# Patient Record
Sex: Female | Born: 1990 | Race: White | Hispanic: No | Marital: Married | Smoking: Never smoker
Health system: Southern US, Community
[De-identification: ages and names within clinical notes are randomized; demographics above are authoritative.]

## PROBLEM LIST (undated history)

## (undated) DIAGNOSIS — F909 Attention-deficit hyperactivity disorder, unspecified type: Secondary | ICD-10-CM

## (undated) DIAGNOSIS — F419 Anxiety disorder, unspecified: Secondary | ICD-10-CM

## (undated) HISTORY — PX: WISDOM TOOTH EXTRACTION: SHX21

---

## 2016-02-22 ENCOUNTER — Encounter: Payer: Self-pay | Admitting: Emergency Medicine

## 2016-02-22 ENCOUNTER — Emergency Department: Payer: BLUE CROSS/BLUE SHIELD

## 2016-02-22 ENCOUNTER — Emergency Department
Admission: EM | Admit: 2016-02-22 | Discharge: 2016-02-22 | Disposition: A | Discharge status: Home / Self Care | Payer: BLUE CROSS/BLUE SHIELD | Attending: Emergency Medicine | Admitting: Emergency Medicine

## 2016-02-22 DIAGNOSIS — Z79899 Other long term (current) drug therapy: Secondary | ICD-10-CM | POA: Insufficient documentation

## 2016-02-22 DIAGNOSIS — D369 Benign neoplasm, unspecified site: Secondary | ICD-10-CM

## 2016-02-22 DIAGNOSIS — R1084 Generalized abdominal pain: Secondary | ICD-10-CM | POA: Diagnosis present

## 2016-02-22 DIAGNOSIS — R112 Nausea with vomiting, unspecified: Secondary | ICD-10-CM | POA: Diagnosis not present

## 2016-02-22 DIAGNOSIS — N83201 Unspecified ovarian cyst, right side: Secondary | ICD-10-CM | POA: Insufficient documentation

## 2016-02-22 DIAGNOSIS — R109 Unspecified abdominal pain: Secondary | ICD-10-CM

## 2016-02-22 DIAGNOSIS — N83202 Unspecified ovarian cyst, left side: Secondary | ICD-10-CM | POA: Diagnosis not present

## 2016-02-22 LAB — CBC
HCT: 41.5 % (ref 35.0–47.0)
Hemoglobin: 14.2 g/dL (ref 12.0–16.0)
MCH: 29.8 pg (ref 26.0–34.0)
MCHC: 34.1 g/dL (ref 32.0–36.0)
MCV: 87.4 fL (ref 80.0–100.0)
Platelets: 218 10*3/uL (ref 150–440)
RBC: 4.74 MIL/uL (ref 3.80–5.20)
RDW: 13.3 % (ref 11.5–14.5)
WBC: 14.8 10*3/uL — ABNORMAL HIGH (ref 3.6–11.0)

## 2016-02-22 LAB — COMPREHENSIVE METABOLIC PANEL
ALT: 16 U/L (ref 14–54)
AST: 24 U/L (ref 15–41)
Albumin: 4.4 g/dL (ref 3.5–5.0)
Alkaline Phosphatase: 72 U/L (ref 38–126)
Anion gap: 10 (ref 5–15)
BUN: 15 mg/dL (ref 6–20)
CO2: 22 mmol/L (ref 22–32)
Calcium: 9.2 mg/dL (ref 8.9–10.3)
Chloride: 104 mmol/L (ref 101–111)
Creatinine, Ser: 0.58 mg/dL (ref 0.44–1.00)
GFR calc Af Amer: >60 mL/min (ref >60)
GFR calc non Af Amer: >60 mL/min (ref >60)
Glucose, Bld: 106 mg/dL — ABNORMAL HIGH (ref 65–99)
Potassium: 3.5 mmol/L (ref 3.5–5.1)
Sodium: 136 mmol/L (ref 135–145)
Total Bilirubin: 1.2 mg/dL (ref 0.3–1.2)
Total Protein: 7.2 g/dL (ref 6.5–8.1)

## 2016-02-22 LAB — URINALYSIS COMPLETE WITH MICROSCOPIC (ARMC ONLY)
Bacteria, UA: NONE SEEN
Bilirubin Urine: NEGATIVE
Glucose, UA: NEGATIVE mg/dL
Hgb urine dipstick: NEGATIVE
Leukocytes, UA: NEGATIVE
Nitrite: NEGATIVE
Protein, ur: NEGATIVE mg/dL
Specific Gravity, Urine: 1.024 (ref 1.005–1.030)
pH: 6 (ref 5.0–8.0)

## 2016-02-22 LAB — LIPASE, BLOOD: Lipase: 19 U/L (ref 11–51)

## 2016-02-22 LAB — POCT PREGNANCY, URINE: Preg Test, Ur: NEGATIVE

## 2016-02-22 MED ORDER — ONDANSETRON HCL 4 MG/2ML IJ SOLN
4.0000 mg | Freq: Once | INTRAMUSCULAR | Status: AC
  Administered 2016-02-22: 4 mg via INTRAVENOUS
  Filled 2016-02-22: qty 2

## 2016-02-22 MED ORDER — MORPHINE SULFATE (PF) 4 MG/ML IV SOLN: 4.0000 mg | Freq: Once | INTRAVENOUS | Status: DC

## 2016-02-22 MED ORDER — OXYCODONE-ACETAMINOPHEN 5-325 MG PO TABS: 2.0000 | ORAL_TABLET | Freq: Four times a day (QID) | ORAL | 0 refills | Status: DC | PRN

## 2016-02-22 MED ORDER — IOPAMIDOL (ISOVUE-300) INJECTION 61%
75.0000 mL | Freq: Once | INTRAVENOUS | Status: AC | PRN
  Administered 2016-02-22: 75 mL via INTRAVENOUS

## 2016-02-22 MED ORDER — IBUPROFEN 800 MG PO TABS: 800.0000 mg | ORAL_TABLET | Freq: Three times a day (TID) | ORAL | 0 refills | Status: DC | PRN

## 2016-02-22 MED ORDER — MORPHINE SULFATE (PF) 4 MG/ML IV SOLN
4.0000 mg | Freq: Once | INTRAVENOUS | Status: AC
  Administered 2016-02-22: 4 mg via INTRAVENOUS
  Filled 2016-02-22: qty 1

## 2016-02-22 MED ORDER — DIATRIZOATE MEGLUMINE & SODIUM 66-10 % PO SOLN
15.0000 mL | Freq: Once | ORAL | Status: AC
  Administered 2016-02-22: 15 mL via ORAL

## 2016-02-22 NOTE — ED Triage Notes (Signed)
C/O vomiting started this morning at around 0630.  Shortly after, c/o mid - lower right abdominal pain.  Last vomited at around 0930.  Continues to c/o nausea.  Denies diarrhea.  Denies dysuria.

## 2016-02-22 NOTE — ED Notes (Signed)
Patient returned from Ultrasound. 

## 2016-02-22 NOTE — ED Provider Notes (Signed)
[facility] Emergency Department Provider Note  Time seen: 2:04 PM  I have reviewed the triage vital signs and the nursing notes.   HISTORY  Chief Complaint Abdominal Pain; Nausea; and Emesis    HPI Maria [name] is a 25 y.o. female with no past medical history presents the emergency department with nausea, vomiting and diffuse abdominal pain.Patient states she awoke this morning with nausea, vomiting and developed abdominal pain initially in the epigastrium which is spread to her entire abdomen. Describes the pain as moderate, dull aching pain throughout her entire abdomen. Denies diarrhea or dysuria. Denies hematuria. Denies vaginal bleeding or discharge. Patient is on Mirena birth control, and does not normally have. So she cannot tell me her last period. Describes the pain as moderate to severe.  History reviewed. No pertinent past medical history.  There are no active problems to display for this patient.   Past Surgical History:  Procedure Laterality Date  . WISDOM TOOTH EXTRACTION      Prior to Admission medications   Not on File    No Known Allergies  No family history on file.  Social History Social History  Substance Use Topics  . Smoking status: Never Smoker  . Smokeless tobacco: Never Used  . Alcohol use Yes     Comment: occasionally    Review of Systems Constitutional: Negative for fever Cardiovascular: Negative for chest pain. Respiratory: Negative for shortness of breath. Gastrointestinal: Diffuse abdominal pain. Positive for nausea or vomiting. Negative for diarrhea. Genitourinary: Negative for dysuria. Negative for vaginal bleeding or discharge. Musculoskeletal: Negative for back pain. Neurological: Negative for headaches, focal weakness or numbness. 10-point ROS otherwise negative.  ____________________________________________   PHYSICAL EXAM:  VITAL SIGNS: ED Triage Vitals  Enc Vitals Group     BP 02/22/16 1338 (!)  88/55     Pulse Rate 02/22/16 1338 (!) 108     Resp 02/22/16 1338 18     Temp 02/22/16 1338 98.3 F (36.8 C)     Temp Source 02/22/16 1338 Oral     SpO2 02/22/16 1338 98 %     Weight 02/22/16 1338 130 lb (59 kg)     Height 02/22/16 1338 5\' 4"  (1.626 m)     Head Circumference --      Peak Flow --      Pain Score 02/22/16 1339 10     Pain Loc --      Pain Edu? --      Excl. in [place]? --     Constitutional: Alert and oriented. Well appearing and in no distress. Eyes: Normal exam ENT   Head: Normocephalic and atraumatic.   Mouth/Throat: Mucous membranes are moist. Cardiovascular: Normal rate, regular rhythm. No murmur Respiratory: Normal respiratory effort without tachypnea nor retractions. Breath sounds are clear Gastrointestinal: Soft, moderate diffuse abdominal tenderness to palpation. Not able to localize a more tender area than any other spot. Musculoskeletal: Nontender with normal range of motion in all extremities. Neurologic:  Normal speech and language. No gross focal neurologic deficits Psychiatric: Mood and affect are normal. Speech and behavior are normal.   ____________________________________________   RADIOLOGY  CT pending  ____________________________________________   INITIAL IMPRESSION / ASSESSMENT AND PLAN / ED COURSE  Pertinent labs & imaging results that were available during my care of the patient were reviewed by me and considered in my medical decision making (see chart for details).  The patient presents to the emergency department with diffuse abdominal pain, nausea and vomiting all  beginning this morning. Initially patient with a low blood pressure 88/55 and very pale appearance. I performed a bedside fast ultrasound which was negative for fluid. Patient's blood pressure came up to 106 without intervention. At this time it is unclear what is causing the patient's discomfort. She has diffuse abdominal tenderness to palpation, I am not able to  localize a more tender area. We will check labs, IV hydrate, treat pain and nausea, and proceed with a CT of the abdomen/pelvis. Patient's POC pregnancy test is negative.   Patient's labs show a leukocytosis, largely otherwise within normal limits. Patient is feeling better after medications and fluids. We'll proceed to CT abdomen/pelvis to further evaluate.  Patient care signed out to Dr. Jimmye Norman, CT pending.  ____________________________________________   FINAL CLINICAL IMPRESSION(S) / ED DIAGNOSES  Abdominal pain Nausea and vomiting    Harvest Dark, MD 02/22/16 1443

## 2016-02-22 NOTE — ED Notes (Signed)
Patient transported to Ultrasound 

## 2016-02-22 NOTE — ED Provider Notes (Signed)
IMPRESSION: 1. Left adnexal dermoid measuring up to 3.7 cm. 2. Dominant cyst in the right ovary measures 4.2 cm. This is almost certainly benign, and no specific imaging follow up is recommended according to the Society of Radiologists in [place] Statement (D Clovis Riley et al. Management of Asymptomatic Ovarian and Other Adnexal Cysts Imaged at [place]: Society of Radiologists in [place] Statement 2010. Radiology 256 (Sept 2010): 943-954.). 3. IUD in place. 4. Normal arterial and venous blood flow to both ovaries without evidence for torsion.  Patient is in no acute distress, she'll be referred to GYN for outpatient follow-up. Currently she is pain-free.   Earleen Newport, MD 02/22/16 [phone]

## 2016-02-22 NOTE — ED Notes (Signed)
Pt presents with nausea vomiting since about 6 am today.  Approximately 4 episodes of vomiting.  Diffuse abdominal pain 10/10 at first then localizing more to the right abdomen and around to back.

## 2016-02-26 ENCOUNTER — Encounter
Admission: RE | Admit: 2016-02-26 | Discharge: 2016-02-26 | Disposition: A | Discharge status: Home / Self Care | Payer: BLUE CROSS/BLUE SHIELD | Source: Ambulatory Visit | Attending: Obstetrics and Gynecology | Admitting: Obstetrics and Gynecology

## 2016-02-26 DIAGNOSIS — Z01812 Encounter for preprocedural laboratory examination: Secondary | ICD-10-CM | POA: Diagnosis present

## 2016-02-26 HISTORY — DX: Attention-deficit hyperactivity disorder, unspecified type: F90.9

## 2016-02-26 LAB — CBC
HCT: 37.4 % (ref 35.0–47.0)
Hemoglobin: 13.4 g/dL (ref 12.0–16.0)
MCH: 31 pg (ref 26.0–34.0)
MCHC: 36 g/dL (ref 32.0–36.0)
MCV: 86.2 fL (ref 80.0–100.0)
Platelets: 187 10*3/uL (ref 150–440)
RBC: 4.33 MIL/uL (ref 3.80–5.20)
RDW: 12.9 % (ref 11.5–14.5)
WBC: 4.4 10*3/uL (ref 3.6–11.0)

## 2016-02-26 LAB — BASIC METABOLIC PANEL
Anion gap: 6 (ref 5–15)
BUN: 8 mg/dL (ref 6–20)
CO2: 28 mmol/L (ref 22–32)
Calcium: 9.2 mg/dL (ref 8.9–10.3)
Chloride: 106 mmol/L (ref 101–111)
Creatinine, Ser: 0.59 mg/dL (ref 0.44–1.00)
GFR calc Af Amer: >60 mL/min (ref >60)
GFR calc non Af Amer: >60 mL/min (ref >60)
Glucose, Bld: 85 mg/dL (ref 65–99)
Potassium: 4.2 mmol/L (ref 3.5–5.1)
Sodium: 140 mmol/L (ref 135–145)

## 2016-02-26 LAB — TYPE AND SCREEN
ABO/RH(D): O POS
Antibody Screen: NEGATIVE

## 2016-02-26 NOTE — H&P (Signed)
[name] is a 25 y.o. female here for Ovarian Cyst .  24yo G0 presented to the ER on 7/29 with acute onset abdominal/pelvic [name] with nausea/vomiting. Preg test neg. No vaginal bleeding.  CT scan found LEFT ovarian dermoid cyst [id number] and right follicular cyst, confirmed by TVUS. No evidence of torsion. [name] with po meds, pt discharged.  [name]. She states it comes and goes but is mild. No nausea/ vomiting  Mirena in place. No menses. Unknown LMP.  Prior hx of WTE without complications.  Maternal grandmother with hx of ovarian cysts, but no surgeries or cancer that she is aware of.  Past Medical History:  has no past medical history on file.  Past Surgical History:  has a past surgical history that includes wisdom teeth extraction (Bilateral). Family History: Family history is unknown by patient. Social History:  reports that she has never smoked. She has never used smokeless tobacco. She reports that she drinks alcohol. She reports that she does not use illicit drugs. OB/GYN History:  OB History    Gravida Para Term Preterm AB Living   0 0 0 0 0 0   SAB TAB Ectopic Multiple Live Births   0 0 0 0 0      Allergies: has no allergies on file. Medications:  Current Outpatient Prescriptions:  .  dextroamphetamine-amphetamine (ADDERALL) 10 mg tablet, Take by mouth., Disp: , Rfl:  .  ibuprofen (ADVIL,MOTRIN) 800 MG tablet, Take by mouth., Disp: , Rfl:  .  levonorgestrel (MIRENA) 20 mcg/24 hr (5 years) IUD, Insert into the uterus., Disp: , Rfl:  .  oxyCODONE-acetaminophen (PERCOCET) 5-325 mg tablet, Take by mouth., Disp: , Rfl:   Review of Systems: No SOB, no palpitations or chest [name], no new lower extremity edema, no nausea or vomiting or bowel or bladder complaints. See HPI for gyn specific ROS.    Exam:      Vitals:   02/23/16 1444  BP: (P) 106/74  Pulse: (P) 105   Body mass index is 22.59 kg/(m^2) (pended).  General:  Well-developed, well-nourished white female in no acute distress Body mass index is 22.59 kg/(m^2) (pended). Skin: No rashes, ulcers or skin lesions noted. No excessive hirsutism or acne noted.  Neurological: Appears alert and oriented and is a good historian. No gross abnormalities are noted. Psychological: Normal affect and mood. No signs of anxiety or depression noted. Lungs: CTA  CV : RRR without murmur   Breast: deferred Neck:  no thyromegaly Abdomen: Soft, nontender, without hepatosplenomegaly or masses. No hernias. Pelvic exam: Deferred    Impression:   The primary encounter diagnosis was Dermoid cyst of ovary, left. A diagnosis of [name] was also pertinent to this visit.    Plan:   - Patient returns for a preoperative discussion regarding her plans to proceed with surgical treatment of her 4cm LEFT dermoid by dx lap, left ovarian cystectomy, possible oopherectomy procedure. There is no evidence of torsion or acute abdomen. She is aware that we may have to remove an ovary, but that this is not optimal.  The patient and I discussed the technical aspects of the procedure including the potential for risks and complications. These include but are not limited to the risk of infection requiring post-operative antibiotics or further procedures. We talked about the risk of injury to adjacent organs including bladder, bowel, ureter, blood vessels or nerves. We talked about the need to convert to an open incision. We talked  about the possible need for blood transfusion. We talked aboutpostop complications such asthromboembolic or cardiopulmonary complications. All of her questions were answered.  Her preoperative exam was completed and the appropriate consents were signed. She is scheduled to undergo this procedure in the near future, this week if possible.  Specific Peri-operative Considerations:  - Consent: obtained today - Labs: CBC, CMP preoperatively -  Studies: EKG, CXR preoperatively - Bowel Preparation: None required - Abx:  Ancef2g - VTE ppx: SCDs perioperatively - Glucose Protocol: n/a - Beta-blockade: n/a  Return in about 2 weeks (around 03/08/2016) for Postop check.

## 2016-02-26 NOTE — Patient Instructions (Signed)
  Your procedure is scheduled on: 03/01/16 Report to Day Surgery. [facility]OND [facility]LOOR To find out your arrival time please call [phone] between 1PM - 3PM on 02/27/16  Remember: Instructions that are not followed completely may result in serious medical risk, up to and including death, or upon the discretion of your surgeon and anesthesiologist your surgery may need to be rescheduled.    _X___ 1. Do not eat food or drink liquids after midnight. No gum chewing or hard candies.     _X___ 2. No Alcohol for 24 hours before or after surgery.   _X___ 3. Do Not Smoke For 24 Hours Prior to Your Surgery.   ____ 4. Bring all medications with you on the day of surgery if instructed.    X____ 5. Notify your doctor if there is any change in your medical condition     (cold, fever, infections).       Do not wear jewelry, make-up, hairpins, clips or nail polish.  Do not wear lotions, powders, or perfumes. You may wear deodorant.  Do not shave 48 hours prior to surgery. Men may shave face and neck.  Do not bring valuables to the hospital.    [facility] is not responsible for any belongings or valuables.               Contacts, dentures or bridgework may not be worn into surgery.  Leave your suitcase in the car. After surgery it may be brought to your room.  For patients admitted to the hospital, discharge time is determined by your                treatment team.   Patients discharged the day of surgery will not be allowed to drive home.   Please read over the following fact sheets that you were given:   Surgical Site Infection Prevention   ____ Take these medicines the morning of surgery with A SIP OF WATER:    1. NONE  2.   3.   4.  5.  6.  ____ Fleet Enema (as directed)   _C___ Use CHG Soap as directed  ____ Use inhalers on the day of surgery  ____ Stop metformin 2 days prior to surgery    ____ Take 1/2 of usual insulin dose the night before surgery and none on the  morning of surgery.   ____ Stop Coumadin/Plavix/aspirin on   _X___ Stop Anti-inflammatories on     NO ADVIL UNTIL AFTER SURGERY       OK TO TAKE TYLENOL   ____ Stop supplements until after surgery.    ____ Bring C-Pap to the hospital.

## 2016-03-01 ENCOUNTER — Ambulatory Visit
Admission: RE | Admit: 2016-03-01 | Discharge: 2016-03-01 | Disposition: A | Discharge status: Home / Self Care | Payer: BLUE CROSS/BLUE SHIELD | Source: Ambulatory Visit | Attending: Obstetrics and Gynecology | Admitting: Obstetrics and Gynecology

## 2016-03-01 ENCOUNTER — Ambulatory Visit: Payer: BLUE CROSS/BLUE SHIELD | Admitting: Anesthesiology

## 2016-03-01 ENCOUNTER — Encounter: Payer: Self-pay | Admitting: *Deleted

## 2016-03-01 ENCOUNTER — Encounter
Admission: RE | Disposition: A | Discharge status: Home / Self Care | Payer: Self-pay | Source: Ambulatory Visit | Attending: Obstetrics and Gynecology

## 2016-03-01 DIAGNOSIS — D271 Benign neoplasm of left ovary: Secondary | ICD-10-CM | POA: Diagnosis not present

## 2016-03-01 DIAGNOSIS — N8301 Follicular cyst of right ovary: Secondary | ICD-10-CM | POA: Diagnosis not present

## 2016-03-01 DIAGNOSIS — Z842 Family history of other diseases of the genitourinary system: Secondary | ICD-10-CM | POA: Insufficient documentation

## 2016-03-01 DIAGNOSIS — Z79899 Other long term (current) drug therapy: Secondary | ICD-10-CM | POA: Diagnosis not present

## 2016-03-01 DIAGNOSIS — N83202 Unspecified ovarian cyst, left side: Secondary | ICD-10-CM | POA: Diagnosis present

## 2016-03-01 DIAGNOSIS — F909 Attention-deficit hyperactivity disorder, unspecified type: Secondary | ICD-10-CM | POA: Insufficient documentation

## 2016-03-01 HISTORY — PX: LAPAROSCOPIC OVARIAN CYSTECTOMY: SHX6248

## 2016-03-01 HISTORY — PX: LAPAROSCOPY: SHX197

## 2016-03-01 LAB — POCT URINE PREGNANCY: Preg Test, Ur: NEGATIVE

## 2016-03-01 SURGERY — LAPAROSCOPY, DIAGNOSTIC
Anesthesia: General

## 2016-03-01 MED ORDER — FAMOTIDINE 20 MG PO TABS
20.0000 mg | ORAL_TABLET | Freq: Once | ORAL | Status: AC
  Administered 2016-03-01: 20 mg via ORAL

## 2016-03-01 MED ORDER — NEOSTIGMINE METHYLSULFATE 10 MG/10ML IV SOLN
INTRAVENOUS | Status: DC | PRN
  Administered 2016-03-01: 4 mg via INTRAVENOUS

## 2016-03-01 MED ORDER — OXYCODONE HCL 5 MG/5ML PO SOLN: 5.0000 mg | Freq: Once | ORAL | Status: AC | PRN

## 2016-03-01 MED ORDER — FAMOTIDINE 20 MG PO TABS
ORAL_TABLET | ORAL | Status: AC
  Administered 2016-03-01: 20 mg via ORAL
  Filled 2016-03-01: qty 1

## 2016-03-01 MED ORDER — ESMOLOL HCL 100 MG/10ML IV SOLN
INTRAVENOUS | Status: DC | PRN
  Administered 2016-03-01 (×2): 20 mg via INTRAVENOUS

## 2016-03-01 MED ORDER — LIDOCAINE HCL (CARDIAC) 20 MG/ML IV SOLN
INTRAVENOUS | Status: DC | PRN
  Administered 2016-03-01: 60 mg via INTRAVENOUS

## 2016-03-01 MED ORDER — OXYCODONE HCL 5 MG PO TABS
5.0000 mg | ORAL_TABLET | Freq: Once | ORAL | Status: AC | PRN
  Administered 2016-03-01: 5 mg via ORAL

## 2016-03-01 MED ORDER — DOCUSATE SODIUM 100 MG PO CAPS: 100.0000 mg | ORAL_CAPSULE | Freq: Every day | ORAL | 1 refills | Status: DC | PRN

## 2016-03-01 MED ORDER — DEXAMETHASONE SODIUM PHOSPHATE 10 MG/ML IJ SOLN
INTRAMUSCULAR | Status: DC | PRN
  Administered 2016-03-01: 10 mg via INTRAVENOUS

## 2016-03-01 MED ORDER — SODIUM CHLORIDE 0.9 % IR SOLN
Status: DC | PRN
  Administered 2016-03-01: 1300 mL

## 2016-03-01 MED ORDER — ROCURONIUM BROMIDE 100 MG/10ML IV SOLN
INTRAVENOUS | Status: DC | PRN
  Administered 2016-03-01: 10 mg via INTRAVENOUS
  Administered 2016-03-01: 30 mg via INTRAVENOUS
  Administered 2016-03-01 (×3): 10 mg via INTRAVENOUS

## 2016-03-01 MED ORDER — OXYCODONE-ACETAMINOPHEN 5-325 MG PO TABS: 1.0000 | ORAL_TABLET | Freq: Four times a day (QID) | ORAL | 0 refills | Status: DC | PRN

## 2016-03-01 MED ORDER — KETOROLAC TROMETHAMINE 30 MG/ML IJ SOLN
30.0000 mg | Freq: Once | INTRAMUSCULAR | Status: AC
  Administered 2016-03-01: 30 mg via INTRAVENOUS

## 2016-03-01 MED ORDER — MIDAZOLAM HCL 2 MG/2ML IJ SOLN
INTRAMUSCULAR | Status: DC | PRN
Start: 2016-03-01 — End: 2016-03-01
  Administered 2016-03-01: 2 mg via INTRAVENOUS

## 2016-03-01 MED ORDER — ONDANSETRON HCL 4 MG/2ML IJ SOLN
INTRAMUSCULAR | Status: DC | PRN
  Administered 2016-03-01: 4 mg via INTRAVENOUS

## 2016-03-01 MED ORDER — KETOROLAC TROMETHAMINE 30 MG/ML IJ SOLN
INTRAMUSCULAR | Status: AC
  Filled 2016-03-01: qty 1

## 2016-03-01 MED ORDER — FENTANYL CITRATE (PF) 100 MCG/2ML IJ SOLN
INTRAMUSCULAR | Status: DC | PRN
  Administered 2016-03-01: 100 ug via INTRAVENOUS
  Administered 2016-03-01 (×3): 50 ug via INTRAVENOUS

## 2016-03-01 MED ORDER — FENTANYL CITRATE (PF) 100 MCG/2ML IJ SOLN: 25.0000 ug | INTRAMUSCULAR | Status: DC | PRN

## 2016-03-01 MED ORDER — IBUPROFEN 800 MG PO TABS: 800.0000 mg | ORAL_TABLET | Freq: Three times a day (TID) | ORAL | 1 refills | Status: DC | PRN

## 2016-03-01 MED ORDER — LACTATED RINGERS IV SOLN: INTRAVENOUS | Status: DC

## 2016-03-01 MED ORDER — BUPIVACAINE HCL 0.5 % IJ SOLN
INTRAMUSCULAR | Status: DC | PRN
  Administered 2016-03-01 (×2): 15 mL

## 2016-03-01 MED ORDER — BUPIVACAINE HCL (PF) 0.5 % IJ SOLN
INTRAMUSCULAR | Status: AC
  Filled 2016-03-01: qty 30

## 2016-03-01 MED ORDER — OXYCODONE HCL 5 MG PO TABS
ORAL_TABLET | ORAL | Status: AC
  Administered 2016-03-01: 5 mg via ORAL
  Filled 2016-03-01: qty 1

## 2016-03-01 MED ORDER — METHYLENE BLUE 0.5 % INJ SOLN
INTRAVENOUS | Status: AC
  Filled 2016-03-01: qty 10

## 2016-03-01 MED ORDER — LACTATED RINGERS IV SOLN
INTRAVENOUS | Status: DC
  Administered 2016-03-01 (×2): via INTRAVENOUS

## 2016-03-01 MED ORDER — PROPOFOL 10 MG/ML IV BOLUS
INTRAVENOUS | Status: DC | PRN
  Administered 2016-03-01: 150 mg via INTRAVENOUS

## 2016-03-01 MED ORDER — GLYCOPYRROLATE 0.2 MG/ML IJ SOLN
INTRAMUSCULAR | Status: DC | PRN
  Administered 2016-03-01: 0.6 mg via INTRAVENOUS

## 2016-03-01 SURGICAL SUPPLY — 45 items
APPLICATOR ARISTA FLEXITIP XL (MISCELLANEOUS) ×3 IMPLANT
BAG URO DRAIN 2000ML W/SPOUT (MISCELLANEOUS) ×3 IMPLANT
BLADE SURG SZ11 CARB STEEL (BLADE) ×3 IMPLANT
CATH FOLEY 2WAY  5CC 16FR (CATHETERS)
CATH ROBINSON RED A/P 16FR (CATHETERS) ×3 IMPLANT
CATH URTH 16FR FL 2W BLN LF (CATHETERS) IMPLANT
CHLORAPREP W/TINT 26ML (MISCELLANEOUS) ×3 IMPLANT
DRSG TEGADERM 2-3/8X2-3/4 SM (GAUZE/BANDAGES/DRESSINGS) ×12 IMPLANT
ENDOPOUCH RETRIEVER 10 (MISCELLANEOUS) ×3 IMPLANT
GAUZE SPONGE NON-WVN 2X2 STRL (MISCELLANEOUS) ×4 IMPLANT
GLOVE BIO SURGEON STRL SZ 6.5 (GLOVE) ×12 IMPLANT
GLOVE INDICATOR 7.0 STRL GRN (GLOVE) ×12 IMPLANT
GLOVE SURG LX 6.5 MICRO (GLOVE) ×1
GLOVE SURG LX STRL 6.5 MICRO (GLOVE) ×2 IMPLANT
GOWN STRL REUS W/ TWL LRG LVL3 (GOWN DISPOSABLE) ×4 IMPLANT
GOWN STRL REUS W/TWL LRG LVL3 (GOWN DISPOSABLE) ×2
GRASPER SUT TROCAR 14GX15 (MISCELLANEOUS) ×3 IMPLANT
HEMOSTAT ARISTA ABSORB 1G (MISCELLANEOUS) ×3 IMPLANT
HEMOSTAT SURGICEL 2X3 (HEMOSTASIS) ×3 IMPLANT
IRRIGATION STRYKERFLOW (MISCELLANEOUS) ×2 IMPLANT
IRRIGATOR STRYKERFLOW (MISCELLANEOUS) ×3
IV SOD CHL 0.9% 1000ML (IV SOLUTION) ×3 IMPLANT
KIT RM TURNOVER CYSTO AR (KITS) ×3 IMPLANT
LABEL OR SOLS (LABEL) ×3 IMPLANT
LIGASURE 5MM LAPAROSCOPIC (INSTRUMENTS) IMPLANT
LIQUID BAND (GAUZE/BANDAGES/DRESSINGS) ×3 IMPLANT
NS IRRIG 500ML POUR BTL (IV SOLUTION) ×3 IMPLANT
PACK GYN LAPAROSCOPIC (MISCELLANEOUS) ×3 IMPLANT
PAD OB MATERNITY 4.3X12.25 (PERSONAL CARE ITEMS) ×3 IMPLANT
PAD PREP 24X41 OB/GYN DISP (PERSONAL CARE ITEMS) ×3 IMPLANT
SCISSORS METZENBAUM CVD 33 (INSTRUMENTS) ×3 IMPLANT
SLEEVE ENDOPATH XCEL 5M (ENDOMECHANICALS) ×6 IMPLANT
SPONGE VERSALON 2X2 STRL (MISCELLANEOUS) ×2
SPONGE XRAY 4X4 16PLY STRL (MISCELLANEOUS) ×3 IMPLANT
STRIP CLOSURE SKIN 1/4X4 (GAUZE/BANDAGES/DRESSINGS) ×3 IMPLANT
SUT MNCRL AB 4-0 PS2 18 (SUTURE) ×3 IMPLANT
SUT VIC AB 0 CT1 36 (SUTURE) ×3 IMPLANT
SUT VIC AB 2-0 UR6 27 (SUTURE) ×3 IMPLANT
SUT VIC AB 4-0 SH 27 (SUTURE) ×1
SUT VIC AB 4-0 SH 27XANBCTRL (SUTURE) ×2 IMPLANT
SWABSTK COMLB BENZOIN TINCTURE (MISCELLANEOUS) IMPLANT
TROCAR ENDO BLADELESS 11MM (ENDOMECHANICALS) ×3 IMPLANT
TROCAR XCEL NON-BLD 5MMX100MML (ENDOMECHANICALS) ×3 IMPLANT
TROCAR XCEL UNIV SLVE 11M 100M (ENDOMECHANICALS) ×3 IMPLANT
TUBING INSUFFLATOR HI FLOW (MISCELLANEOUS) ×3 IMPLANT

## 2016-03-01 NOTE — Op Note (Addendum)
Maria [name] PROCEDURE DATE: 03/01/2016  PREOPERATIVE DIAGNOSIS: Left ovarian cyst POSTOPERATIVE DIAGNOSIS: Same PROCEDURE: Diagnostic laparoscopy, left ovarian cystectomy SURGEON:  Dr. Benjaman Kindler ASSISTANT: RN-FA ANESTHESIOLOGIST: Andria Frames, MD Anesthesiologist: Andria Frames, MD CRNA: Lance Muss, CRNA; Leander Rams, CRNA  INDICATIONS: 25 y.o. G0 with left dermoid cyst measuring 4cm by ultrasound and CT.  Please see preoperative notes for further details.  Risks of surgery were discussed with the patient including but not limited to: bleeding which may require transfusion or reoperation; infection which may require antibiotics; injury to bowel, bladder, ureters or other surrounding organs; need for additional procedures including laparotomy; thromboembolic phenomenon, incisional problems and other postoperative/anesthesia complications. Written informed consent was obtained.    FINDINGS:  Small uterus, normal right ovary and fallopian tubes bilaterally.  Left ovary with large ovarian cyst. Normal upper abdomen. No adhesions.  ANESTHESIA:    General INTRAVENOUS FLUIDS: 1400 ml ESTIMATED BLOOD LOSS: 25 ml URINE OUTPUT: 75 ml SPECIMENS:left ovarian cyst, pelvic washings COMPLICATIONS: None immediate  PROCEDURE IN DETAIL:  The patient had sequential compression devices applied to her lower extremities while in the preoperative area.  She was then taken to the operating room where general anesthesia was administered and was found to be adequate.  She was placed in the dorsal lithotomy position, and was prepped and draped in a sterile manner.  A Foley catheter was inserted into her bladder and attached to constant drainage and a sponge stick was placed into the vagina. No uterine manipulator was used. IUD strings were noted at the cervical os.   After an adequate timeout was performed, attention was turned to the abdomen where an umbilical incision was made with the scalpel.   The Optiview 11-mm trocar and sleeve were then advanced without difficulty with the laparoscope under direct visualization into the abdomen.  The abdomen was then insufflated with carbon dioxide gas and adequate pneumoperitoneum was obtained.   A detailed survey of the patient's pelvis and abdomen revealed the findings as mentioned above.   Pelvic washings were obtained.  After infusion of 1% lidocaine at the proposed trocar sites, a 10 mm trocar was placed in the left lower quadrant and a 5 mm trocar was placed in the right lower quadrant. A 10 mm Endo Catch bag was placed under the left adnexa. The left fallopian tube was gently displaced from the bag. Another 5 mm trocar was placed in the right lower quadrant to allow for appropriate manipulation. Using monopolar scissors, a small incision was made in the ovarian cortex above the ovarian cyst. The cyst wall was gently dissected away from the ovarian tissue, being careful not to rupture the cyst. Once completed, the cyst was dropped into the Endo Catch bag with no cyst content spillage into the peritoneal cavity. Yellow fluid did extrude from the cyst wall in the bag, mixed with hair, confirming this as a dermoid cyst.  The cyst was removed through the left lower port site without spillage into the subcutaneous tissue. The trocar was replaced, and 3 L of normal saline was used to irrigate the pelvis. The bipolar Kleppinger was used to assure hemostasis in the ovarian cyst bed, which was bleeding, and Surgicel was placed in the site and held firmly. The pressure in the abdomen was dropped to 6 mmHg, and the operative site was assured to be hemostatic.  No intraoperative injury to surrounding organs was noted. The fascia of the two 10 mm sites were closed with 0 Vicryl laparoscopically. Pictures  were taken of the quadrants and pelvis. The abdomen was desufflated and all instruments were then removed from the patient's abdomen. The vaginal manipulator was  removed without complications.  All incisions were closed with 4-0 Monocryl and Dermabond.   The patient tolerated the procedures well.  All instruments, needles, and sponge counts were correct x 2. The patient was taken to the recovery room in stable condition.

## 2016-03-01 NOTE — Anesthesia Preprocedure Evaluation (Signed)
Anesthesia Evaluation  Patient identified by MRN, date of birth, ID band Patient awake    Reviewed: Allergy & Precautions, H&P , NPO status , Patient's Chart, lab work & pertinent test results  Airway Mallampati: II  TM Distance: >3 FB Neck ROM: full    Dental  (+) Poor Dentition   Pulmonary neg pulmonary ROS, neg shortness of breath,    Pulmonary exam normal breath sounds clear to auscultation       Cardiovascular Exercise Tolerance: Good (-) angina(-) Past MI and (-) DOE negative cardio ROS Normal cardiovascular exam Rhythm:regular Rate:Normal     Neuro/Psych PSYCHIATRIC DISORDERS negative neurological ROS     GI/Hepatic negative GI ROS, Neg liver ROS,   Endo/Other  negative endocrine ROS  Renal/GU negative Renal ROS  negative genitourinary   Musculoskeletal   Abdominal   Peds  Hematology negative hematology ROS (+)   Anesthesia Other Findings Past Medical History: No date: ADHD (attention deficit hyperactivity disorder)  Past Surgical History: No date: WISDOM TOOTH EXTRACTION No date: WISDOM TOOTH EXTRACTION  BMI    Body Mass Index:  22.31 kg/m      Reproductive/Obstetrics negative OB ROS                             Anesthesia Physical Anesthesia Plan  ASA: II  Anesthesia Plan: General ETT   Post-op Pain Management:    Induction:   Airway Management Planned:   Additional Equipment:   Intra-op Plan:   Post-operative Plan:   Informed Consent: I have reviewed the patients History and Physical, chart, labs and discussed the procedure including the risks, benefits and alternatives for the proposed anesthesia with the patient or authorized representative who has indicated his/her understanding and acceptance.   Dental Advisory Given  Plan Discussed with: Anesthesiologist, CRNA and Surgeon  Anesthesia Plan Comments:         Anesthesia Quick Evaluation

## 2016-03-01 NOTE — Transfer of Care (Signed)
Immediate Anesthesia Transfer of Care Note  Patient: Maria [name]  Procedure(s) Performed: Procedure(s): LAPAROSCOPY DIAGNOSTIC (N/A) LAPAROSCOPIC OVARIAN CYSTECTOMY (Left)  Patient Location: PACU  Anesthesia Type:General  Level of Consciousness: awake  Airway & Oxygen Therapy: Patient Spontanous Breathing  Post-op Assessment: Report given to RN  Post vital signs: stable  Last Vitals:  Vitals:   03/01/16 0616 03/01/16 1028  BP: 108/65 116/71  Pulse: 99 80  Resp: 16 14  Temp: 36.9 C 36.2 C    Last Pain:  Vitals:   03/01/16 1028  TempSrc:   PainSc: Asleep         Complications: No apparent anesthesia complications

## 2016-03-01 NOTE — Anesthesia Postprocedure Evaluation (Signed)
Anesthesia Post Note  Patient: Maria [name]  Procedure(s) Performed: Procedure(s) (LRB): LAPAROSCOPY DIAGNOSTIC (N/A) LAPAROSCOPIC OVARIAN CYSTECTOMY (Left)  Patient location during evaluation: PACU Anesthesia Type: General Level of consciousness: awake and alert Pain management: pain level controlled Vital Signs Assessment: post-procedure vital signs reviewed and stable Respiratory status: spontaneous breathing, nonlabored ventilation, respiratory function stable and patient connected to nasal cannula oxygen Cardiovascular status: blood pressure returned to baseline and stable Postop Assessment: no signs of nausea or vomiting Anesthetic complications: no    Last Vitals:  Vitals:   03/01/16 1115 03/01/16 1129  BP: 107/67 106/71  Pulse: (!) 101 89  Resp: 19 18  Temp: 36.3 C 36.6 C    Last Pain:  Vitals:   03/01/16 1129  TempSrc: Oral  PainSc: 6                  Maria [name]

## 2016-03-01 NOTE — Anesthesia Procedure Notes (Signed)
Procedure Name: Intubation Date/Time: 03/01/2016 7:45 AM Performed by: Leander Rams Pre-anesthesia Checklist: Patient identified, Emergency Drugs available, Suction available, Patient being monitored and Timeout performed Patient Re-evaluated:Patient Re-evaluated prior to inductionOxygen Delivery Method: Circle system utilized Intubation Type: IV induction Ventilation: Mask ventilation without difficulty Laryngoscope Size: Mac and 3 Grade View: Grade I Tube type: Oral Tube size: 6.5 mm Number of attempts: 2 Airway Equipment and Method: Stylet Secured at: 23 cm Tube secured with: Tape Dental Injury: Teeth and Oropharynx as per pre-operative assessment

## 2016-03-01 NOTE — Discharge Instructions (Signed)
AMBULATORY SURGERY  DISCHARGE INSTRUCTIONS   1) The drugs that you were given will stay in your system until tomorrow so for the next 24 hours you should not:  A) Drive an automobile B) Make any legal decisions C) Drink any alcoholic beverage   2) You may resume regular meals tomorrow.  Today it is better to start with liquids and gradually work up to solid foods.  You may eat anything you prefer, but it is better to start with liquids, then soup and crackers, and gradually work up to solid foods.   3) Please notify your doctor immediately if you have any unusual bleeding, trouble breathing, redness and pain at the surgery site, drainage, fever, or pain not relieved by medication.    4) Additional Instructions:        Please contact your physician with any problems or Same Day Surgery at [phone], Monday through Friday 6 am to 4 pm, or [place] at [facility] number at [phone].Laparoscopic Ovarian Surgery Discharge Instructions  RISKS AND COMPLICATIONS   Infection.  Bleeding.  Injury to surrounding organs.  Anesthetic side effects.   PROCEDURE   You may be given a medicine to help you relax (sedative) before the procedure. You will be given a medicine to make you sleep (general anesthetic) during the procedure.  A tube will be put down your throat to help your breath while under general anesthesia.  Several small cuts (incisions) are made in the lower abdominal area and one incision is made near the belly button.  Your abdominal area will be inflated with a safe gas (carbon dioxide). This helps give the surgeon room to operate, visualize, and helps the surgeon avoid other organs.  A thin, lighted tube (laparoscope) with a camera attached is inserted into your abdomen through the incision near the belly button. Other small instruments may also be inserted through other abdominal incisions.  The ovary is located and are removed.  After the ovary is  removed, the gas is released from the abdomen.  The incisions will be closed with stitches (sutures), and Dermabond. A bandage may be placed over the incisions.  AFTER THE PROCEDURE   You will also have some mild abdominal discomfort for 3-7 days. You will be given pain medicine to ease any discomfort.  As long as there are no problems, you may be allowed to go home. Someone will need to drive you home and be with you for at least 24 hours once home.  You may have some mild discomfort in the throat. This is from the tube placed in your throat while you were sleeping.  You may experience discomfort in the shoulder area from some trapped air between the liver and diaphragm. This sensation is normal and will slowly go away on its own.  HOME CARE INSTRUCTIONS   Take all medicines as directed.  Only take over-the-counter or prescription medicines for pain, discomfort, or fever as directed by your caregiver.  Resume daily activities as directed.  Showers are preferred over baths for 2 weeks.  You may resume sexual activities in 1 week or as you feel you would like to.  Do not drive while taking narcotics.  SEEK MEDICAL CARE IF: .  There is increasing abdominal pain.  You feel lightheaded or faint.  You have the chills.  You have an oral temperature above 102 F (38.9 C).  There is pus-like (purulent) drainage from any of the wounds.  You are unable to pass gas or  have a bowel movement.  You feel sick to your stomach (nauseous) or throw up (vomit) and can't control it with your medicines.  MAKE SURE YOU:   Understand these instructions.  Will watch your condition.  Will get help right away if you are not doing well or get worse.  ExitCare Patient Information 2013 [place].   AMBULATORY SURGERY  DISCHARGE INSTRUCTIONS   5) The drugs that you were given will stay in your system until tomorrow so for the next 24 hours you should not:  D) Drive an  automobile E) Make any legal decisions F) Drink any alcoholic beverage   6) You may resume regular meals tomorrow.  Today it is better to start with liquids and gradually work up to solid foods.  You may eat anything you prefer, but it is better to start with liquids, then soup and crackers, and gradually work up to solid foods.   7) Please notify your doctor immediately if you have any unusual bleeding, trouble breathing, redness and pain at the surgery site, drainage, fever, or pain not relieved by medication.    8) Additional Instructions:        Please contact your physician with any problems or Same Day Surgery at [phone], Monday through Friday 6 am to 4 pm, or [place] at [facility] number at [phone].

## 2016-03-01 NOTE — Interval H&P Note (Signed)
History and Physical Interval Note:  03/01/2016 7:30 AM  Maria [name]  has presented today for surgery, with the diagnosis of Dermoid  Pelvic pain  The various methods of treatment have been discussed with the patient and family. After consideration of risks, benefits and other options for treatment, the patient has consented to  Procedure(s): LAPAROSCOPY DIAGNOSTIC (Left) LAPAROSCOPIC OVARIAN CYSTECTOMY (Left), possible LAPAROSCOPIC OOPHORECTOMY (Left) as a surgical intervention .  The patient's history has been reviewed, patient examined, no change in status, stable for surgery.  I have reviewed the patient's chart and labs.  Questions were answered to the patient's satisfaction.     Benjaman Kindler

## 2016-03-02 LAB — CYTOLOGY - NON PAP

## 2016-03-02 LAB — SURGICAL PATHOLOGY

## 2016-03-03 LAB — POCT PREGNANCY, URINE: Preg Test, Ur: NEGATIVE

## 2017-09-12 ENCOUNTER — Encounter: Payer: Self-pay | Admitting: Emergency Medicine

## 2017-09-12 ENCOUNTER — Other Ambulatory Visit: Payer: Self-pay

## 2017-09-12 ENCOUNTER — Emergency Department: Payer: BLUE CROSS/BLUE SHIELD

## 2017-09-12 ENCOUNTER — Emergency Department
Admission: EM | Admit: 2017-09-12 | Discharge: 2017-09-12 | Disposition: A | Discharge status: Home / Self Care | Payer: BLUE CROSS/BLUE SHIELD | Attending: Emergency Medicine | Admitting: Emergency Medicine

## 2017-09-12 DIAGNOSIS — R110 Nausea: Secondary | ICD-10-CM

## 2017-09-12 DIAGNOSIS — Z3202 Encounter for pregnancy test, result negative: Secondary | ICD-10-CM | POA: Insufficient documentation

## 2017-09-12 DIAGNOSIS — R1010 Upper abdominal pain, unspecified: Secondary | ICD-10-CM

## 2017-09-12 DIAGNOSIS — R140 Abdominal distension (gaseous): Secondary | ICD-10-CM | POA: Insufficient documentation

## 2017-09-12 DIAGNOSIS — F17290 Nicotine dependence, other tobacco product, uncomplicated: Secondary | ICD-10-CM | POA: Insufficient documentation

## 2017-09-12 DIAGNOSIS — Z79899 Other long term (current) drug therapy: Secondary | ICD-10-CM | POA: Diagnosis not present

## 2017-09-12 HISTORY — DX: Anxiety disorder, unspecified: F41.9

## 2017-09-12 LAB — POCT PREGNANCY, URINE: Preg Test, Ur: NEGATIVE

## 2017-09-12 LAB — CBC
HCT: 39.3 % (ref 35.0–47.0)
Hemoglobin: 13.4 g/dL (ref 12.0–16.0)
MCH: 30 pg (ref 26.0–34.0)
MCHC: 34.2 g/dL (ref 32.0–36.0)
MCV: 87.8 fL (ref 80.0–100.0)
Platelets: 210 10*3/uL (ref 150–440)
RBC: 4.48 MIL/uL (ref 3.80–5.20)
RDW: 12.9 % (ref 11.5–14.5)
WBC: 7.3 10*3/uL (ref 3.6–11.0)

## 2017-09-12 LAB — COMPREHENSIVE METABOLIC PANEL
ALT: 15 U/L (ref 14–54)
AST: 20 U/L (ref 15–41)
Albumin: 4.1 g/dL (ref 3.5–5.0)
Alkaline Phosphatase: 61 U/L (ref 38–126)
Anion gap: 11 (ref 5–15)
BUN: 12 mg/dL (ref 6–20)
CO2: 19 mmol/L — ABNORMAL LOW (ref 22–32)
Calcium: 8.5 mg/dL — ABNORMAL LOW (ref 8.9–10.3)
Chloride: 108 mmol/L (ref 101–111)
Creatinine, Ser: 0.45 mg/dL (ref 0.44–1.00)
GFR calc Af Amer: >60 mL/min (ref >60)
GFR calc non Af Amer: >60 mL/min (ref >60)
Glucose, Bld: 101 mg/dL — ABNORMAL HIGH (ref 65–99)
Potassium: 3.4 mmol/L — ABNORMAL LOW (ref 3.5–5.1)
Sodium: 138 mmol/L (ref 135–145)
Total Bilirubin: 0.5 mg/dL (ref 0.3–1.2)
Total Protein: 7.2 g/dL (ref 6.5–8.1)

## 2017-09-12 LAB — URINALYSIS, COMPLETE (UACMP) WITH MICROSCOPIC
Bilirubin Urine: NEGATIVE
Glucose, UA: NEGATIVE mg/dL
Ketones, ur: NEGATIVE mg/dL
Leukocytes, UA: NEGATIVE
Nitrite: NEGATIVE
Protein, ur: NEGATIVE mg/dL
Specific Gravity, Urine: 1.009 (ref 1.005–1.030)
pH: 5 (ref 5.0–8.0)

## 2017-09-12 LAB — LIPASE, BLOOD: Lipase: 43 U/L (ref 11–51)

## 2017-09-12 MED ORDER — ONDANSETRON HCL 4 MG PO TABS: ORAL_TABLET | ORAL | 0 refills | Status: DC

## 2017-09-12 NOTE — ED Triage Notes (Signed)
Pt reports feeling nauseated all day Friday with vomiting; says she woke about 1 hour ago and noticed her abd was distended and she felt nauseated; "I can't even suck it in"; c/o burning to lower abd; pt reports urinary frequency; pt in no acute distress in triage; talking in complete coherent sentences

## 2017-09-12 NOTE — ED Provider Notes (Signed)
[facility] Emergency Department Provider Note  ____________________________________________   First MD Initiated Contact with Patient 09/12/17 [phone]     (approximate)  I have reviewed the triage vital signs and the nursing notes.   HISTORY  Chief Complaint Abdominal Pain and Emesis    HPI Maria [name] is a 27 y.o. female with no contributory past medical history who presents for evaluation of episodic nausea and the sensation of abdominal distention.  She reports that the  Nausea has been present off and on for about 3 days.  It seems to be worse within about an hour she had one episode of vomiting 3 days ago but no emesis since that time.  She describes mild aching pain in the upper part of her abdomen as well.  She awoke just prior to arrival with the pain, nausea, and a sensation of distention.  The distention and the nausea have gone away since she has been waiting for an exam room, but she still reports some mild discomfort in her upper abdomen.  Nothing in particular makes it better and it seems to get worse after eating.  She reports that multiple people in her family have had a "GI bug" recently, but she has not had any symptoms before now.  She denies fever/chills, chest pain, shortness of breath, diarrhea, and dysuria.    Past Medical History:  Diagnosis Date  . ADHD (attention deficit hyperactivity disorder)   . Anxiety     There are no active problems to display for this patient.   Past Surgical History:  Procedure Laterality Date  . LAPAROSCOPIC OVARIAN CYSTECTOMY Left 03/01/2016   Procedure: LAPAROSCOPIC OVARIAN CYSTECTOMY;  Surgeon: Benjaman Kindler, MD;  Location: [facility] ORS;  Service: Gynecology;  Laterality: Left;  . LAPAROSCOPY N/A 03/01/2016   Procedure: LAPAROSCOPY DIAGNOSTIC;  Surgeon: Benjaman Kindler, MD;  Location: [facility] ORS;  Service: Gynecology;  Laterality: N/A;  . WISDOM TOOTH EXTRACTION    . WISDOM TOOTH EXTRACTION      Prior to  Admission medications   Medication Sig Start Date End Date Taking? Authorizing Provider  ibuprofen (ADVIL,MOTRIN) 800 MG tablet Take 1 tablet (800 mg total) by mouth every 8 (eight) hours as needed. 02/22/16  Yes Earleen Newport, MD  levonorgestrel (MIRENA) 20 MCG/24HR IUD 1 each by Intrauterine route once.   Yes [provider]  sertraline (ZOLOFT) 50 MG tablet Take 50 mg by mouth daily.   Yes [provider]  ondansetron (ZOFRAN) 4 MG tablet Take 1-2 tabs by mouth every 8 hours as needed for nausea/vomiting 09/12/17   Hinda Kehr, MD    Allergies Patient has no known allergies.  History reviewed. No pertinent family history.  Social History Social History   Tobacco Use  . Smoking status: Current Every Day Smoker    Types: E-cigarettes  . Smokeless tobacco: Never Used  Substance Use Topics  . Alcohol use: Yes    Comment: occasionally  . Drug use: No    Review of Systems Constitutional: No fever/chills Eyes: No visual changes. ENT: No sore throat. Cardiovascular: Denies chest pain. Respiratory: Denies shortness of breath. Gastrointestinal: Nominal pain and nausea as described above with a brief episode of distention genitourinary: Negative for dysuria. Musculoskeletal: Negative for neck pain.  Negative for back pain. Integumentary: Negative for rash. Neurological: Negative for headaches, focal weakness or numbness.   ____________________________________________   PHYSICAL EXAM:  VITAL SIGNS: ED Triage Vitals  Enc Vitals Group     BP 09/12/17 0200  114/77     Pulse Rate 09/12/17 0200 100     Resp 09/12/17 0200 18     Temp 09/12/17 0200 98.7 F (37.1 C)     Temp Source 09/12/17 0200 Oral     SpO2 09/12/17 0200 97 %     Weight 09/12/17 0200 61.2 kg (135 lb)     Height 09/12/17 0200 1.626 m (5\' 4" )     Head Circumference --      Peak Flow --      Pain Score 09/12/17 0219 6     Pain Loc --      Pain Edu? --      Excl. in [place]? --      Constitutional: Alert and oriented. Well appearing and in no acute distress. Eyes: Conjunctivae are normal.  Head: Atraumatic. Nose: No congestion/rhinnorhea. Mouth/Throat: Mucous membranes are moist. Neck: No stridor.  No meningeal signs.   Cardiovascular: Normal rate, regular rhythm. Good peripheral circulation. Grossly normal heart sounds. Respiratory: Normal respiratory effort.  No retractions. Lungs CTAB. Gastrointestinal: Soft with mild distention of the epigastrium, negative Murphy sign, no tenderness to palpation of the lower abdomen Musculoskeletal: No lower extremity tenderness nor edema. No gross deformities of extremities. Neurologic:  Normal speech and language. No gross focal neurologic deficits are appreciated.  Skin:  Skin is warm, dry and intact. No rash noted. Psychiatric: Mood and affect are normal. Speech and behavior are normal.  ____________________________________________   LABS (all labs ordered are listed, but only abnormal results are displayed)  Labs Reviewed  COMPREHENSIVE METABOLIC PANEL - Abnormal; Notable for the following components:      Result Value   Potassium 3.4 (*)    CO2 19 (*)    Glucose, Bld 101 (*)    Calcium 8.5 (*)    All other components within normal limits  URINALYSIS, COMPLETE (UACMP) WITH MICROSCOPIC - Abnormal; Notable for the following components:   Color, Urine STRAW (*)    APPearance CLEAR (*)    Hgb urine dipstick SMALL (*)    Bacteria, UA RARE (*)    Squamous Epithelial / LPF 0-5 (*)    All other components within normal limits  LIPASE, BLOOD  CBC  POC URINE PREG, ED  POCT PREGNANCY, URINE   ____________________________________________  EKG  None - EKG not ordered by ED physician ____________________________________________  RADIOLOGY   ED MD interpretation: No evidence of acute abnormality on ultrasound  Official radiology report(s): US Abdomen Limited Ruq  Result Date: 09/12/2017 CLINICAL DATA:   Epigastric pain and nausea, worse after eating. EXAM: ULTRASOUND ABDOMEN LIMITED RIGHT UPPER QUADRANT COMPARISON:  None. FINDINGS: Gallbladder: Physiologically distended. No gallstones or wall thickening visualized. No sonographic Murphy sign noted by sonographer. Common bile duct: Diameter: 2 mm, normal. Liver: No focal lesion identified. Within normal limits in parenchymal echogenicity. Portal vein is patent on color Doppler imaging with normal direction of blood flow towards the liver. IMPRESSION: Normal right upper quadrant ultrasound. Electronically Signed   By: Jeb Levering M.D.   On: 09/12/2017 06:43    ____________________________________________   PROCEDURES  Critical Care performed: No   Procedure(s) performed:   Procedures   ____________________________________________   INITIAL IMPRESSION / ASSESSMENT AND PLAN / ED COURSE  As part of my medical decision making, I reviewed the following data within the [place] notes reviewed and incorporated and Labs reviewed     Differential diagnosis includes, but is not limited to, biliary disease (biliary colic, acute cholecystitis,  cholangitis, choledocholithiasis, etc), intrathoracic causes for epigastric abdominal pain including ACS, gastritis, duodenitis, pancreatitis, small bowel or large bowel obstruction, abdominal aortic aneurysm, hernia, and gastritis.  Patient is very well-appearing with normal vital signs and normal lab work.  I suspect that she most likely had a mild gastrointestinal virus similar to the rest of her family members.  I discussed with her the plan for discharge and outpatient follow-up but given her mild tenderness to the epigastrium she prefers to proceed with an ultrasound.  I will do so but then will likely discharge her with some antiemetics and return precautions.  No indication for CT scan of the abdomen at this time.  Clinical Course as of Sep 12 732  Mon Sep 12, 2017   1062 No evidence of any acute abnormality on ultrasound.  The patient is still asymptomatic.  I advised her that I think this is most likely the result of a nonspecific viral illness, and   I gave my usual and customary return precautions.  He is comfortable with the plan for discharge and outpatient follow-up. US ABDOMEN LIMITED RUQ [CF]    Clinical Course User Index [CF] Hinda Kehr, MD    ____________________________________________  FINAL CLINICAL IMPRESSION(S) / ED DIAGNOSES  Final diagnoses:  Nausea  Pain of upper abdomen  Abdominal distention     MEDICATIONS GIVEN DURING THIS VISIT:  Medications - No data to display   ED Discharge Orders        Ordered    ondansetron (ZOFRAN) 4 MG tablet     09/12/17 0654       Note:  This document was prepared using Dragon voice recognition software and may include unintentional dictation errors.    Hinda Kehr, MD 09/12/17 [phone]

## 2017-09-12 NOTE — Discharge Instructions (Signed)
You have been seen in the Emergency Department (ED) for abdominal pain and bloating as well as nausea.  Your evaluation did not identify a clear cause of your symptoms but was generally reassuring.  Please follow up as instructed above regarding today?s emergent visit and the symptoms that are bothering you.  Return to the ED if your abdominal pain worsens or fails to improve, you develop bloody vomiting, bloody diarrhea, you are unable to tolerate fluids due to vomiting, fever greater than 101, or other symptoms that concern you.

## 2017-09-29 ENCOUNTER — Ambulatory Visit: Payer: Self-pay | Admitting: Physician Assistant

## 2017-10-13 ENCOUNTER — Encounter: Payer: Self-pay | Admitting: Physician Assistant

## 2017-10-13 ENCOUNTER — Ambulatory Visit: Payer: BLUE CROSS/BLUE SHIELD | Admitting: Physician Assistant

## 2017-10-13 VITALS — BP 118/78 | HR 64 | Temp 98.5°F | Resp 16 | Ht 64.0 in | Wt 134.0 lb

## 2017-10-13 DIAGNOSIS — Z13220 Encounter for screening for lipoid disorders: Secondary | ICD-10-CM

## 2017-10-13 DIAGNOSIS — F419 Anxiety disorder, unspecified: Secondary | ICD-10-CM

## 2017-10-13 DIAGNOSIS — Z1329 Encounter for screening for other suspected endocrine disorder: Secondary | ICD-10-CM

## 2017-10-13 DIAGNOSIS — Z23 Encounter for immunization: Secondary | ICD-10-CM

## 2017-10-13 DIAGNOSIS — Z114 Encounter for screening for human immunodeficiency virus [HIV]: Secondary | ICD-10-CM | POA: Diagnosis not present

## 2017-10-13 NOTE — Progress Notes (Signed)
Patient: Maria [name] Female    DOB: [birth year]   27 y.o.   MRN: [id number] Visit Date: 10/13/2017  Today's Provider: Trinna Post, PA-C   Chief Complaint  Patient presents with  . Establish Care   Subjective:    HPI  Maria [name] is a 27 y/o woman presenting today to establish care. She was previously seen at a [facility] in [place] but recently moved out to [place] with her boyfriend of five years.  She has an Mirena IUD placed 2 years. Her PAP last year was normal. She is not having periods currently. She reports normal periods prior to Mirena IUD.  She works as a Print production planner.   She is seen by a psychiatrist in [place] for anxiety. She is on Lexapro or zoloft, she does not remember which. She takes this consistently and follows up with psychiatry every 3 months.   She does not appear in [facility], does not remember her pediatrician's office. She reports she hasn't been vaccinated in many years, does not recall Tetanus shot. She says her mom might know her pediatrician's previous office.       No Known Allergies   Current Outpatient Medications:  .  levonorgestrel (MIRENA) 20 MCG/24HR IUD, 1 each by Intrauterine route once., Disp: , Rfl:  .  ibuprofen (ADVIL,MOTRIN) 800 MG tablet, Take 1 tablet (800 mg total) by mouth every 8 (eight) hours as needed., Disp: 30 tablet, Rfl: 0 .  sertraline (ZOLOFT) 50 MG tablet, Take 50 mg by mouth daily., Disp: , Rfl:   Review of Systems  Social History   Tobacco Use  . Smoking status: Never Smoker  . Smokeless tobacco: Never Used  Substance Use Topics  . Alcohol use: Yes    Alcohol/week: 1.2 oz    Types: 1 Glasses of wine, 1 Cans of beer per week    Comment: occasionally   Objective:   BP 118/78 (BP Location: Right Arm, Patient Position: Sitting, Cuff Size: Normal)   Pulse 64   Temp 98.5 F (36.9 C) (Oral)   Resp 16   Ht 5\' 4"  (1.626 m)   Wt 134 lb (60.8 kg)   BMI 23.00 kg/m    Vitals:   10/13/17 1447  BP: 118/78  Pulse: 64  Resp: 16  Temp: 98.5 F (36.9 C)  TempSrc: Oral  Weight: 134 lb (60.8 kg)  Height: 5\' 4"  (1.626 m)     Physical Exam  Constitutional: She is oriented to person, place, and time. She appears well-developed and well-nourished.  Cardiovascular: Normal rate and regular rhythm.  Pulmonary/Chest: Effort normal and breath sounds normal.  Abdominal: Soft. Bowel sounds are normal.  Neurological: She is alert and oriented to person, place, and time.  Skin: Skin is warm and dry.  Psychiatric: She has a normal mood and affect. Her behavior is normal.        Assessment & Plan:     1. Anxiety  Stable, sees psychiatry.  2. Encounter for screening for HIV  - HIV antibody (with reflex)  3. Screening cholesterol level  - Lipid Profile  4. Screening for thyroid disorder  - TSH  5. Need for Tdap vaccination  - Tdap vaccine greater than or equal to 7yo IM  Return in about 1 year (around 10/14/2018) for CPE.  The entirety of the information documented in the History of Present Illness, Review of Systems and Physical Exam were personally obtained by me. Portions of  this information were initially documented by Ashley Royalty, CMA and reviewed by me for thoroughness and accuracy.        Trinna Post, PA-C  [facility]

## 2017-10-13 NOTE — Patient Instructions (Signed)

## 2017-10-14 ENCOUNTER — Telehealth: Payer: Self-pay

## 2017-10-14 LAB — LIPID PANEL
Chol/HDL Ratio: 2.8 ratio (ref 0.0–4.4)
Cholesterol, Total: 179 mg/dL (ref 100–199)
HDL: 65 mg/dL (ref >39)
LDL Calculated: 106 mg/dL — ABNORMAL HIGH (ref 0–99)
Triglycerides: 40 mg/dL (ref 0–149)
VLDL Cholesterol Cal: 8 mg/dL (ref 5–40)

## 2017-10-14 LAB — TSH: TSH: 1.47 u[IU]/mL (ref 0.450–4.500)

## 2017-10-14 LAB — HIV ANTIBODY (ROUTINE TESTING W REFLEX): HIV Screen 4th Generation wRfx: NONREACTIVE

## 2017-10-14 NOTE — Telephone Encounter (Signed)
-----   Message from Trinna Post, [place] sent at 10/14/2017  9:17 AM EDT ----- Worthy Keeler is all normal.

## 2017-10-14 NOTE — Telephone Encounter (Signed)
[facility]  ED   ----- Message from Trinna Post, PA-C sent at 10/14/2017  9:17 AM EDT ----- Maria [name] is all normal.

## 2017-10-19 NOTE — Telephone Encounter (Signed)
LMTCB 10/19/2017  Thanks,   -Mickel Baas

## 2017-10-25 NOTE — Telephone Encounter (Signed)
Pt advised.   Thanks,   -Laura  

## 2018-08-17 ENCOUNTER — Other Ambulatory Visit (HOSPITAL_COMMUNITY)
Admission: RE | Admit: 2018-08-17 | Discharge: 2018-08-17 | Disposition: A | Discharge status: Home / Self Care | Payer: Managed Care, Other (non HMO) | Source: Ambulatory Visit | Attending: Obstetrics and Gynecology | Admitting: Obstetrics and Gynecology

## 2018-08-17 ENCOUNTER — Ambulatory Visit (INDEPENDENT_AMBULATORY_CARE_PROVIDER_SITE_OTHER): Payer: Managed Care, Other (non HMO) | Admitting: Obstetrics and Gynecology

## 2018-08-17 ENCOUNTER — Encounter: Payer: Self-pay | Admitting: Obstetrics and Gynecology

## 2018-08-17 VITALS — BP 120/64 | HR 85 | Ht 64.0 in | Wt 143.0 lb

## 2018-08-17 DIAGNOSIS — Z30432 Encounter for removal of intrauterine contraceptive device: Secondary | ICD-10-CM | POA: Diagnosis not present

## 2018-08-17 DIAGNOSIS — Z124 Encounter for screening for malignant neoplasm of cervix: Secondary | ICD-10-CM | POA: Insufficient documentation

## 2018-08-17 NOTE — Progress Notes (Signed)
   Chief Complaint  Patient presents with  . 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 Since that time, she denies dyspareunia, pelvic pain, non-menstrual bleeding, vaginal d/c, heavy bleeding. She would like to conceive. Taking vits currently. No recent pap, no hx of abn paps/STDs. No dyspareunia.    BP 120/64   Pulse 85   Ht 5\' 4"  (1.626 m)   Wt 143 lb (64.9 kg)   BMI 24.55 kg/m   Pelvic exam:  Two IUD strings present seen coming from the cervical os. EGBUS, vaginal vault and cervix: within normal limits  IUD Removal Strings of IUD identified and grasped.  IUD removed without problem with ring forceps.  Pt tolerated this well.  IUD noted to be intact.  Assessment/Plan:  Encounter for removal of intrauterine contraceptive device (IUD)  Cervical cancer screening - Plan: Cytology - PAP  Return if symptoms worsen or fail to improve. /prn NOB.  Alicia B. Copland, PA-C 08/17/2018 3:33 PM

## 2018-08-17 NOTE — Patient Instructions (Signed)
I value your feedback and entrusting us with your care. If you get a [facility] patient survey, I would appreciate you taking the time to let us know about your experience today. Thank you! 

## 2018-08-21 LAB — CYTOLOGY - PAP
Adequacy: ABSENT
Diagnosis: NEGATIVE

## 2019-01-08 ENCOUNTER — Other Ambulatory Visit (HOSPITAL_COMMUNITY)
Admission: RE | Admit: 2019-01-08 | Discharge: 2019-01-08 | Disposition: A | Discharge status: Home / Self Care | Payer: Managed Care, Other (non HMO) | Source: Ambulatory Visit | Attending: Obstetrics & Gynecology | Admitting: Obstetrics & Gynecology

## 2019-01-08 ENCOUNTER — Encounter: Payer: Self-pay | Admitting: Obstetrics & Gynecology

## 2019-01-08 ENCOUNTER — Other Ambulatory Visit: Payer: Self-pay

## 2019-01-08 ENCOUNTER — Ambulatory Visit (INDEPENDENT_AMBULATORY_CARE_PROVIDER_SITE_OTHER): Payer: Managed Care, Other (non HMO) | Admitting: Obstetrics & Gynecology

## 2019-01-08 VITALS — BP 120/70 | Wt 130.0 lb

## 2019-01-08 DIAGNOSIS — Z3A01 Less than 8 weeks gestation of pregnancy: Secondary | ICD-10-CM

## 2019-01-08 DIAGNOSIS — Z369 Encounter for antenatal screening, unspecified: Secondary | ICD-10-CM | POA: Diagnosis present

## 2019-01-08 DIAGNOSIS — Z3201 Encounter for pregnancy test, result positive: Secondary | ICD-10-CM | POA: Diagnosis not present

## 2019-01-08 DIAGNOSIS — Z3490 Encounter for supervision of normal pregnancy, unspecified, unspecified trimester: Secondary | ICD-10-CM | POA: Insufficient documentation

## 2019-01-08 DIAGNOSIS — Z113 Encounter for screening for infections with a predominantly sexual mode of transmission: Secondary | ICD-10-CM | POA: Insufficient documentation

## 2019-01-08 DIAGNOSIS — N926 Irregular menstruation, unspecified: Secondary | ICD-10-CM

## 2019-01-08 DIAGNOSIS — Z3401 Encounter for supervision of normal first pregnancy, first trimester: Secondary | ICD-10-CM

## 2019-01-08 LAB — POCT URINE PREGNANCY: Preg Test, Ur: POSITIVE — AB

## 2019-01-08 LAB — POCT URINALYSIS DIPSTICK OB
Glucose, UA: NEGATIVE
POC,PROTEIN,UA: NEGATIVE

## 2019-01-08 MED ORDER — DOXYLAMINE-PYRIDOXINE 10-10 MG PO TBEC: 2.0000 | DELAYED_RELEASE_TABLET | Freq: Every day | ORAL | 5 refills | Status: DC

## 2019-01-08 NOTE — Progress Notes (Signed)
01/08/2019   Chief Complaint: Missed period  Transfer of Care Patient: no  History of Present Illness: Maria [name] is a 28 y.o. G1P0000 [redacted]w[redacted]d based on Patient's last menstrual period was 11/23/2018. with an Estimated Date of Delivery: 08/30/19, with the above CC.   Her periods were: regular periods every 28 days She was using no method when she conceived. IUD removed 07/2018. She has Positive signs or symptoms of nausea/vomiting of pregnancy. She has Negative signs or symptoms of miscarriage or preterm labor She identifies Negative Zika risk factors for her and her partner On any different medications around the time she conceived/early pregnancy: Yes - Zoloft for anxiety History of varicella: Yes   ROS: A 12-point review of systems was performed and negative, except as stated in the above HPI.  OBGYN History: As per HPI. OB History  Gravida Para Term Preterm AB Living  1 0 0 0 0 0  SAB TAB Ectopic Multiple Live Births  0 0 0 0 0    # Outcome Date GA Lbr Len/2nd Weight Sex Delivery Anes PTL Lv  1 Current             Any issues with any prior pregnancies: not applicable Any prior children are healthy, doing well, without any problems or issues: not applicable History of pap smears: Yes. Last pap smear 1/20. Abnormal: no  History of STIs: No   Past Medical History: Past Medical History:  Diagnosis Date  . ADHD (attention deficit hyperactivity disorder)   . Anxiety     Past Surgical History: Past Surgical History:  Procedure Laterality Date  . LAPAROSCOPIC OVARIAN CYSTECTOMY Left 03/01/2016   Procedure: LAPAROSCOPIC OVARIAN CYSTECTOMY;  Surgeon: Benjaman Kindler, MD;  Location: [facility] ORS;  Service: Gynecology;  Laterality: Left;  . LAPAROSCOPY N/A 03/01/2016   Procedure: LAPAROSCOPY DIAGNOSTIC;  Surgeon: Benjaman Kindler, MD;  Location: [facility] ORS;  Service: Gynecology;  Laterality: N/A;  . WISDOM TOOTH EXTRACTION    . WISDOM TOOTH EXTRACTION      Family History:  Family History   Problem Relation Age of Onset  . Healthy Mother   . Healthy Father    She denies any female cancers, bleeding or blood clotting disorders.  She denies any history of mental retardation, birth defects or genetic disorders in her or the FOB's history  Social History:  Social History   Socioeconomic History  . Marital status: Single    Spouse name: Not on file  . Number of children: Not on file  . Years of education: Not on file  . Highest education level: Not on file  Occupational History  . Not on file  Social Needs  . Financial resource strain: Not on file  . Food insecurity    Worry: Not on file    Inability: Not on file  . Transportation needs    Medical: Not on file    Non-medical: Not on file  Tobacco Use  . Smoking status: Never Smoker  . Smokeless tobacco: Never Used  Substance and Sexual Activity  . Alcohol use: Yes    Alcohol/week: 2.0 standard drinks    Types: 1 Glasses of wine, 1 Cans of beer per week    Comment: occasionally  . Drug use: No  . Sexual activity: Yes    Birth control/protection: None  Lifestyle  . Physical activity    Days per week: Not on file    Minutes per session: Not on file  . Stress: Not on file  Relationships  .  Social Herbalist on phone: Not on file    Gets together: Not on file    Attends religious service: Not on file    Active member of club or organization: Not on file    Attends meetings of clubs or organizations: Not on file    Relationship status: Not on file  . Intimate partner violence    Fear of current or ex partner: Not on file    Emotionally abused: Not on file    Physically abused: Not on file    Forced sexual activity: Not on file  Other Topics Concern  . Not on file  Social History Narrative  . Not on file   Any pets in the household: no  Allergy: No Known Allergies  Current Outpatient Medications:  Current Outpatient Medications:  .  sertraline (ZOLOFT) 50 MG tablet, Take 50 mg by mouth  daily., Disp: , Rfl:  .  Doxylamine-Pyridoxine (DICLEGIS) 10-10 MG TBEC, Take 2 tablets by mouth at bedtime. If symptoms persist, add one tablet in the morning and one in the afternoon, Disp: 100 tablet, Rfl: 5   Physical Exam:   BP 120/70   Wt 130 lb (59 kg)   LMP 11/23/2018   BMI 22.31 kg/m  Body mass index is 22.31 kg/m. Constitutional: Well nourished, well developed female in no acute distress.  Neck:  Supple, normal appearance, and no thyromegaly  Cardiovascular: S1, S2 normal, no murmur, rub or gallop, regular rate and rhythm Respiratory:  Clear to auscultation bilateral. Normal respiratory effort Abdomen: positive bowel sounds and no masses, hernias; diffusely non tender to palpation, non distended Breasts: breasts appear normal, no suspicious masses, no skin or nipple changes or axillary nodes. Neuro/Psych:  Normal mood and affect.  Skin:  Warm and dry.  Lymphatic:  No inguinal lymphadenopathy.   Pelvic exam: is not limited by body habitus EGBUS: within normal limits, Vagina: within normal limits and with no blood in the vault, Cervix: normal appearing cervix without discharge or lesions, closed/long/high, Uterus:  enlarged: 6 weeks, and Adnexa:  normal adnexa  Assessment: Ms. Regnier is a 28 y.o. G1P0000 [redacted]w[redacted]d based on Patient's last menstrual period was 11/23/2018. with an Estimated Date of Delivery: 08/30/19,  for prenatal care.  Plan:  1) Avoid alcoholic beverages. 2) Patient encouraged not to smoke.  3) Discontinue the use of all non-medicinal drugs and chemicals.  4) Take prenatal vitamins daily.  5) Seatbelt use advised 6) Nutrition, food safety (fish, cheese advisories, and high nitrite foods) and exercise discussed. 7) Hospital and practice style delivering at [facility] discussed  8) Patient is asked about travel to areas at risk for the [place] virus, and counseled to avoid travel and exposure to mosquitoes or sexual partners who may have themselves been exposed to the virus.  Testing is discussed, and will be ordered as appropriate.  9) Childbirth classes at [facility] advised 10) Genetic Screening, such as with 1st Trimester Screening, cell free fetal DNA, AFP testing, and Ultrasound, as well as with amniocentesis and CVS as appropriate, is discussed with patient. She plans to have genetic testing this pregnancy. 11) [place] soon  Problem list reviewed and updated.  Maria Applebaum, MD, [facility], [facility] 01/08/2019  3:27 PM

## 2019-01-08 NOTE — Patient Instructions (Addendum)
First Trimester of Pregnancy °The first trimester of pregnancy is from week 1 until the end of week 13 (months 1 through 3). A week after a sperm fertilizes an egg, the egg will implant on the wall of the uterus. This embryo will begin to develop into a baby. Genes from you and your partner will form the baby. The female genes will determine whether the baby will be a boy or a girl. At 6-8 weeks, the eyes and face will be formed, and the heartbeat can be seen on ultrasound. At the end of 12 weeks, all the baby's organs will be formed. °Now that you are pregnant, you will want to do everything you can to have a healthy baby. Two of the most important things are to get good prenatal care and to follow your health care provider's instructions. Prenatal care is all the medical care you receive before the baby's birth. This care will help prevent, find, and treat any problems during the pregnancy and childbirth. °Body changes during your first trimester °Your body goes through many changes during pregnancy. The changes vary from woman to woman. °· You may gain or lose a couple of pounds at first. °· You may feel sick to your stomach (nauseous) and you may throw up (vomit). If the vomiting is uncontrollable, call your health care provider. °· You may tire easily. °· You may develop headaches that can be relieved by medicines. All medicines should be approved by your health care provider. °· You may urinate more often. Painful urination may mean you have a bladder infection. °· You may develop heartburn as a result of your pregnancy. °· You may develop constipation because certain hormones are causing the muscles that push stool through your intestines to slow down. °· You may develop hemorrhoids or swollen veins (varicose veins). °· Your breasts may begin to grow larger and become tender. Your nipples may stick out more, and the tissue that surrounds them (areola) may become darker. °· Your gums may bleed and may be  sensitive to brushing and flossing. °· Dark spots or blotches (chloasma, mask of pregnancy) may develop on your face. This will likely fade after the baby is born. °· Your menstrual periods will stop. °· You may have a loss of appetite. °· You may develop cravings for certain kinds of food. °· You may have changes in your emotions from day to day, such as being excited to be pregnant or being concerned that something may go wrong with the pregnancy and baby. °· You may have more vivid and strange dreams. °· You may have changes in your hair. These can include thickening of your hair, rapid growth, and changes in texture. Some women also have hair loss during or after pregnancy, or hair that feels dry or thin. Your hair will most likely return to normal after your baby is born. °What to expect at prenatal visits °During a routine prenatal visit: °· You will be weighed to make sure you and the baby are growing normally. °· Your blood pressure will be taken. °· Your abdomen will be measured to track your baby's growth. °· The fetal heartbeat will be listened to between weeks 10 and 14 of your pregnancy. °· Test results from any previous visits will be discussed. °Your health care provider may ask you: °· How you are feeling. °· If you are feeling the baby move. °· If you have had any abnormal symptoms, such as leaking fluid, bleeding, severe headaches, or abdominal   cramping. °· If you are using any tobacco products, including cigarettes, chewing tobacco, and electronic cigarettes. °· If you have any questions. °Other tests that may be performed during your first trimester include: °· Blood tests to find your blood type and to check for the presence of any previous infections. The tests will also be used to check for low iron levels (anemia) and protein on red blood cells (Rh antibodies). Depending on your risk factors, or if you previously had diabetes during pregnancy, you may have tests to check for high blood sugar  that affects pregnant women (gestational diabetes). °· Urine tests to check for infections, diabetes, or protein in the urine. °· An ultrasound to confirm the proper growth and development of the baby. °· Fetal screens for spinal cord problems (spina bifida) and Down syndrome. °· HIV (human immunodeficiency virus) testing. Routine prenatal testing includes screening for HIV, unless you choose not to have this test. °· You may need other tests to make sure you and the baby are doing well. °Follow these instructions at home: °Medicines °· Follow your health care provider's instructions regarding medicine use. Specific medicines may be either safe or unsafe to take during pregnancy. °· Take a prenatal vitamin that contains at least 600 micrograms (mcg) of folic acid. °· If you develop constipation, try taking a stool softener if your health care provider approves. °Eating and drinking ° °· Eat a balanced diet that includes fresh fruits and vegetables, whole grains, good sources of protein such as meat, eggs, or tofu, and low-fat dairy. Your health care provider will help you determine the amount of weight gain that is right for you. °· Avoid raw meat and uncooked cheese. These carry germs that can cause birth defects in the baby. °· Eating four or five small meals rather than three large meals a day may help relieve nausea and vomiting. If you start to feel nauseous, eating a few soda crackers can be helpful. Drinking liquids between meals, instead of during meals, also seems to help ease nausea and vomiting. °· Limit foods that are high in fat and processed sugars, such as fried and sweet foods. °· To prevent constipation: °? Eat foods that are high in fiber, such as fresh fruits and vegetables, whole grains, and beans. °? Drink enough fluid to keep your urine clear or pale yellow. °Activity °· Exercise only as directed by your health care provider. Most women can continue their usual exercise routine during  pregnancy. Try to exercise for 30 minutes at least 5 days a week. Exercising will help you: °? Control your weight. °? Stay in shape. °? Be prepared for labor and delivery. °· Experiencing pain or cramping in the lower abdomen or lower back is a good sign that you should stop exercising. Check with your health care provider before continuing with normal exercises. °· Try to avoid standing for long periods of time. Move your legs often if you must stand in one place for a long time. °· Avoid heavy lifting. °· Wear low-heeled shoes and practice good posture. °· You may continue to have sex unless your health care provider tells you not to. °Relieving pain and discomfort °· Wear a good support bra to relieve breast tenderness. °· Take warm sitz baths to soothe any pain or discomfort caused by hemorrhoids. Use hemorrhoid cream if your health care provider approves. °· Rest with your legs elevated if you have leg cramps or low back pain. °· If you develop varicose veins in   your legs, wear support hose. Elevate your feet for 15 minutes, 3-4 times a day. Limit salt in your diet. Prenatal care  Schedule your prenatal visits by the twelfth week of pregnancy. They are usually scheduled monthly at first, then more often in the last 2 months before delivery.  Write down your questions. Take them to your prenatal visits.  Keep all your prenatal visits as told by your health care provider. This is important. Safety  Wear your seat belt at all times when driving.  Make a list of emergency phone numbers, including numbers for family, friends, the hospital, and police and fire departments. General instructions  Ask your health care provider for a referral to a local prenatal education class. Begin classes no later than the beginning of month 6 of your pregnancy.  Ask for help if you have counseling or nutritional needs during pregnancy. Your health care provider can offer advice or refer you to specialists for help  with various needs.  Do not use hot tubs, steam rooms, or saunas.  Do not douche or use tampons or scented sanitary pads.  Do not cross your legs for long periods of time.  Avoid cat litter boxes and soil used by cats. These carry germs that can cause birth defects in the baby and possibly loss of the fetus by miscarriage or stillbirth.  Avoid all smoking, herbs, alcohol, and medicines not prescribed by your health care provider. Chemicals in these products affect the formation and growth of the baby.  Do not use any products that contain nicotine or tobacco, such as cigarettes and e-cigarettes. If you need help quitting, ask your health care provider. You may receive counseling support and other resources to help you quit.  Schedule a dentist appointment. At home, brush your teeth with a soft toothbrush and be gentle when you floss. Contact a health care provider if:  You have dizziness.  You have mild pelvic cramps, pelvic pressure, or nagging pain in the abdominal area.  You have persistent nausea, vomiting, or diarrhea.  You have a bad smelling vaginal discharge.  You have pain when you urinate.  You notice increased swelling in your face, hands, legs, or ankles.  You are exposed to fifth disease or chickenpox.  You are exposed to [place] measles (rubella) and have never had it. Get help right away if:  You have a fever.  You are leaking fluid from your vagina.  You have spotting or bleeding from your vagina.  You have severe abdominal cramping or pain.  You have rapid weight gain or loss.  You vomit blood or material that looks like coffee grounds.  You develop a severe headache.  You have shortness of breath.  You have any kind of trauma, such as from a fall or a car accident. Summary  The first trimester of pregnancy is from week 1 until the end of week 13 (months 1 through 3).  Your body goes through many changes during pregnancy. The changes vary from  woman to woman.  You will have routine prenatal visits. During those visits, your health care provider will examine you, discuss any test results you may have, and talk with you about how you are feeling. This information is not intended to replace advice given to you by your health care provider. Make sure you discuss any questions you have with your health care provider. Document Released: 07/06/2001 Document Revised: 06/23/2016 Document Reviewed: 06/23/2016 Elsevier Interactive Patient Education  2019 [place];  Pyridoxine delayed and extended-release tablets What is this medicine? Doxylamine; pyridoxine (dox IL a meen; peer i DOX een) is a combination of an antihistamine and vitamin B6. The drug is used to treat nausea and vomiting associated with pregnancy. This medicine may be used for other purposes; ask your health care provider or pharmacist if you have questions. COMMON BRAND NAME(S): Diclegis What should I tell my health care provider before I take this medicine? They need to know if you have any of these conditions: -contact lenses -glaucoma -liver disease -lung or breathing disease, like asthma or emphysema -pain or trouble passing urine -prostate trouble -ulcers or other stomach problems -an unusual or allergic reaction to doxylamine or pyridoxine (vitamin B6), other medicines, foods, dyes, or preservatives -breast-feeding How should I use this medicine? Take this medicine by mouth with a glass of water. Do not cut, crush or chew this medicine. Follow the directions on the package or prescription label. Take this medicine on an empty stomach. Take your medicine at regular intervals. Do not take it more often than directed. Talk to your pediatrician regarding the use of this medicine in children. Special care may be needed. Overdosage: If you think you have taken too much of this medicine contact a poison control center or emergency room at once. NOTE: This  medicine is only for you. Do not share this medicine with others. What if I miss a dose? If you miss a dose, take it as soon as you can. If it is almost time for your next dose, take only that dose. Do not take double or extra doses. What may interact with this medicine? -alcohol -atropine -antihistamines for allergy, cough and cold -certain medicines for bladder problems like oxybutynin, tolterodine -certain medicines for stomach problems like dicyclomine, hyoscyamine -certain medicines for travel sickness like scopolamine -certain medicines for Parkinson's disease like benztropine, trihexyphenidyl -ipratropium -MAOIs like Carbex, Eldepryl, Marplan, Nardil, and Parnate This list may not describe all possible interactions. Give your health care provider a list of all the medicines, herbs, non-prescription drugs, or dietary supplements you use. Also tell them if you smoke, drink alcohol, or use illegal drugs. Some items may interact with your medicine. What should I watch for while using this medicine? Tell your doctor or healthcare professional if your symptoms do not start to get better or if they get worse. See your doctor right away if you get a high fever or have problems breathing. Your mouth may get dry. Chewing sugarless gum or sucking hard candy, and drinking plenty of water may help. Contact your doctor if the problem does not go away or is severe. This medicine may cause dry eyes and blurred vision. If you wear contact lenses you may feel some discomfort. Lubricating drops may help. See your eye doctor if the problem does not go away or is severe. This medicine may cause false positive urine tests for certain medicines like methadone, opiates, and PCP. You may get drowsy or dizzy. Do not drive, use machinery, or do anything that needs mental alertness until you know how this medicine affects you. Do not stand or sit up quickly, especially if you are an older patient. This reduces the  risk of dizzy or fainting spells. What side effects may I notice from receiving this medicine? Side effects that you should report to your doctor or health care professional as soon as possible: -allergic reactions like skin rash, itching or hives, swelling of the face, lips, or tongue -changes in vision -  confused, agitated, or nervous -fast, irregular heartbeat -feeling faint or lightheaded, falls -muscle or facial twitches -seizure -trouble passing urine or change in the amount of urine Side effects that usually do not require medical attention (report to your doctor or health care professional if they continue or are bothersome): -dry mouth -headache -loss of appetite -stomach upset This list may not describe all possible side effects. Call your doctor for medical advice about side effects. You may report side effects to FDA at 1-800-FDA-1088. Where should I keep my medicine? Keep out of the reach of children. Store at room temperature between 15 and 30 degrees C (59 and 86 degrees F). Keep bottle tightly closed and protect from moisture. Do not remove desiccant canister from bottle. Throw away any unused medicine after the expiration date. NOTE: This sheet is a summary. It may not cover all possible information. If you have questions about this medicine, talk to your doctor, pharmacist, or health care provider.  2019 Elsevier/Gold Standard (2017-04-21 13:39:10)

## 2019-01-10 LAB — RPR+RH+ABO+RUB AB+AB SCR+CB...
HIV Screen 4th Generation wRfx: NONREACTIVE
Hematocrit: 34.2 % (ref 34.0–46.6)
Hemoglobin: 12 g/dL (ref 11.1–15.9)
Hepatitis B Surface Ag: NEGATIVE
MCH: 30.2 pg (ref 26.6–33.0)
MCHC: 35.1 g/dL (ref 31.5–35.7)
MCV: 86 fL (ref 79–97)
Platelets: 268 10*3/uL (ref 150–450)
RBC: 3.98 x10E6/uL (ref 3.77–5.28)
RDW: 11.9 % (ref 11.7–15.4)
RPR Ser Ql: NONREACTIVE
Rh Factor: POSITIVE
Rubella Antibodies, IGG: 4.23 index (ref >0.99)
Varicella zoster IgG: 155 index — ABNORMAL LOW (ref >165)
WBC: 12.1 10*3/uL — ABNORMAL HIGH (ref 3.4–10.8)

## 2019-01-10 LAB — AB SCR+ANTIBODY ID: Antibody Screen: POSITIVE — AB

## 2019-01-10 LAB — GC/CHLAMYDIA PROBE AMP (~~LOC~~) NOT AT ARMC
Chlamydia: NEGATIVE
Neisseria Gonorrhea: NEGATIVE

## 2019-01-10 LAB — URINE CULTURE

## 2019-01-16 ENCOUNTER — Telehealth: Payer: Self-pay

## 2019-01-16 NOTE — Telephone Encounter (Signed)
Spoke w/patient. Advised to contact PCP to report symptoms & possibly get tested for strep/covid if needed.

## 2019-01-16 NOTE — Telephone Encounter (Signed)
Patient reports several co workers were tested for [employer] were negative. 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 She has a low grade fever. Inquiring what to do. MW#[phone]

## 2019-01-17 ENCOUNTER — Telehealth: Payer: Self-pay | Admitting: *Deleted

## 2019-01-17 ENCOUNTER — Ambulatory Visit: Payer: Self-pay

## 2019-01-17 ENCOUNTER — Encounter: Payer: Self-pay | Admitting: Physician Assistant

## 2019-01-17 ENCOUNTER — Ambulatory Visit (INDEPENDENT_AMBULATORY_CARE_PROVIDER_SITE_OTHER): Payer: BLUE CROSS/BLUE SHIELD | Admitting: Physician Assistant

## 2019-01-17 DIAGNOSIS — Z20822 Contact with and (suspected) exposure to covid-19: Secondary | ICD-10-CM

## 2019-01-17 DIAGNOSIS — J029 Acute pharyngitis, unspecified: Secondary | ICD-10-CM

## 2019-01-17 MED ORDER — AMOXICILLIN 875 MG PO TABS: 875.0000 mg | ORAL_TABLET | Freq: Two times a day (BID) | ORAL | 0 refills | Status: AC

## 2019-01-17 NOTE — Telephone Encounter (Signed)
Pt scheduled for covid testing today @ 11:45 at the [facility] testing site. Instructions given and order placed

## 2019-01-17 NOTE — Progress Notes (Signed)
       Patient: Maria [name] Female    DOB: 30-Jun-1991   28 y.o.   MRN: [id number] Visit Date: 01/17/2019  Today's Provider: Trinna Post, PA-C   Chief Complaint  Patient presents with  . Sore Throat   Subjective:    Virtual Visit via Telephone Note  I connected with Maria [name] on 01/17/19 at  9:40 AM EDT by telephone and verified that I am speaking with the correct person using two identifiers.   I discussed the limitations, risks, security and privacy concerns of performing an evaluation and management service by telephone and the availability of in person appointments. I also discussed with the patient that there may be a patient responsible charge related to this service. The patient expressed understanding and agreed to proceed.  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 Reports low grade fever and itchy throat. She is about [redacted] weeks pregnant. Temperature today 98.6 F. Highest temperature 99.67F yesterday. Denies chest pain, SOB. She is not having abdominal pain or vaginal bleeding.   No Known Allergies   Current Outpatient Medications:  .  Doxylamine-Pyridoxine (DICLEGIS) 10-10 MG TBEC, Take 2 tablets by mouth at bedtime. If symptoms persist, add one tablet in the morning and one in the afternoon, Disp: 100 tablet, Rfl: 5 .  sertraline (ZOLOFT) 50 MG tablet, Take 50 mg by mouth daily., Disp: , Rfl:   Review of Systems  Social History   Tobacco Use  . Smoking status: Never Smoker  . Smokeless tobacco: Never Used  Substance Use Topics  . Alcohol use: Yes    Alcohol/week: 2.0 standard drinks    Types: 1 Glasses of wine, 1 Cans of beer per week    Comment: occasionally      Objective:   LMP 11/23/2018  There were no vitals filed for this visit.   Physical Exam   No  results found for any visits on 01/17/19.     Assessment & Plan    1. Pharyngitis, unspecified etiology  Will cover for strep and order COVID testing.   - amoxicillin (AMOXIL) 875 MG tablet; Take 1 tablet (875 mg total) by mouth 2 (two) times daily for 7 days.  Dispense: 14 tablet; Refill: 0  The entirety of the information documented in the History of Present Illness, Review of Systems and Physical Exam were personally obtained by me. Portions of this information were initially documented by Lynford Humphrey, CMA and reviewed by me for thoroughness and accuracy.   F/u PRN      Trinna Post, PA-C  [facility]

## 2019-01-17 NOTE — Patient Instructions (Signed)
Pharyngitis  Pharyngitis is a sore throat (pharynx). This is when there is redness, pain, and swelling in your throat. Most of the time, this condition gets better on its own. In some cases, you may need medicine. Follow these instructions at home:  Take over-the-counter and prescription medicines only as told by your doctor. ? If you were prescribed an antibiotic medicine, take it as told by your doctor. Do not stop taking the antibiotic even if you start to feel better. ? Do not give children aspirin. Aspirin has been linked to Reye syndrome.  Drink enough water and fluids to keep your pee (urine) clear or pale yellow.  Get a lot of rest.  Rinse your mouth (gargle) with a salt-water mixture 3-4 times a day or as needed. To make a salt-water mixture, completely dissolve -1 tsp of salt in 1 cup of warm water.  If your doctor approves, you may use throat lozenges or sprays to soothe your throat. Contact a doctor if:  You have large, tender lumps in your neck.  You have a rash.  You cough up green, yellow-brown, or bloody spit. Get help right away if:  You have a stiff neck.  You drool or cannot swallow liquids.  You cannot drink or take medicines without throwing up.  You have very bad pain that does not go away with medicine.  You have problems breathing, and it is not from a stuffy nose.  You have new pain and swelling in your knees, ankles, wrists, or elbows. Summary  Pharyngitis is a sore throat (pharynx). This is when there is redness, pain, and swelling in your throat.  If you were prescribed an antibiotic medicine, take it as told by your doctor. Do not stop taking the antibiotic even if you start to feel better.  Most of the time, pharyngitis gets better on its own. Sometimes, you may need medicine. This information is not intended to replace advice given to you by your health care provider. Make sure you discuss any questions you have with your health care  provider. Document Released: 12/29/2007 Document Revised: 08/17/2016 Document Reviewed: 08/17/2016 Elsevier Interactive Patient Education  2019 [employer]vier Inc.  

## 2019-01-21 LAB — NOVEL CORONAVIRUS, NAA: SARS-CoV-2, NAA: NOT DETECTED

## 2019-01-29 ENCOUNTER — Ambulatory Visit (INDEPENDENT_AMBULATORY_CARE_PROVIDER_SITE_OTHER): Payer: Managed Care, Other (non HMO)

## 2019-01-29 ENCOUNTER — Encounter: Payer: Self-pay | Admitting: Maternal Newborn

## 2019-01-29 ENCOUNTER — Ambulatory Visit (INDEPENDENT_AMBULATORY_CARE_PROVIDER_SITE_OTHER): Payer: Managed Care, Other (non HMO) | Admitting: Maternal Newborn

## 2019-01-29 ENCOUNTER — Other Ambulatory Visit: Payer: Self-pay

## 2019-01-29 VITALS — BP 118/62 | Wt 140.0 lb

## 2019-01-29 DIAGNOSIS — Z3A10 10 weeks gestation of pregnancy: Secondary | ICD-10-CM

## 2019-01-29 DIAGNOSIS — Z3490 Encounter for supervision of normal pregnancy, unspecified, unspecified trimester: Secondary | ICD-10-CM

## 2019-01-29 DIAGNOSIS — Z3687 Encounter for antenatal screening for uncertain dates: Secondary | ICD-10-CM | POA: Diagnosis not present

## 2019-01-29 DIAGNOSIS — N8311 Corpus luteum cyst of right ovary: Secondary | ICD-10-CM | POA: Diagnosis not present

## 2019-01-29 DIAGNOSIS — O3481 Maternal care for other abnormalities of pelvic organs, first trimester: Secondary | ICD-10-CM | POA: Diagnosis not present

## 2019-01-29 DIAGNOSIS — Z3401 Encounter for supervision of normal first pregnancy, first trimester: Secondary | ICD-10-CM

## 2019-01-29 DIAGNOSIS — Z369 Encounter for antenatal screening, unspecified: Secondary | ICD-10-CM

## 2019-01-29 DIAGNOSIS — N926 Irregular menstruation, unspecified: Secondary | ICD-10-CM

## 2019-01-29 DIAGNOSIS — Z3A09 9 weeks gestation of pregnancy: Secondary | ICD-10-CM

## 2019-01-29 LAB — POCT URINALYSIS DIPSTICK OB
Glucose, UA: NEGATIVE
POC,PROTEIN,UA: NEGATIVE

## 2019-01-29 NOTE — Progress Notes (Signed)
Dating scan today. No complaints 

## 2019-01-29 NOTE — Progress Notes (Signed)
    Routine Prenatal Care Visit  Subjective  Maria [name] is a 28 y.o. G1P0000 at [redacted]w[redacted]d being seen today for ongoing prenatal care.  She is currently monitored for the following issues for this low-risk pregnancy and has Encounter for supervision of low-risk pregnancy, antepartum on their problem list.  ----------------------------------------------------------------------------------- Patient reports nausea is improving Vag. Bleeding: None.   ----------------------------------------------------------------------------------- The following portions of the patient's history were reviewed and updated as appropriate: allergies, current medications, past family history, past medical history, past social history, past surgical history and problem list. Problem list updated.   Objective  Blood pressure 118/62, weight 140 lb (63.5 kg), last menstrual period 11/23/2018. Pregravid weight 130 lb (59 kg) Total Weight Gain 10 lb (4.536 kg) Urinalysis: Urine dipstick shows negative for glucose, protein.  Fetal Status: Fetal Heart Rate (bpm): Present         General:  Alert, oriented and cooperative. Patient is in no acute distress.  Skin: Skin is warm and dry. No rash  Cardiovascular: Normal heart rate  Respiratory: Normal respiratory effort, no problems with respiration noted  Abdomen:  Pain/Pressure: Absent     Pelvic:  Cervical exam deferred        Extremities: Normal range of motion.     Mental Status: Normal mood and affect. Normal behavior. Normal judgment and thought content.     Assessment   28 y.o. G1P0000 at [redacted]w[redacted]d, EDD 08/30/2019 by Last Menstrual Period presenting for a routine prenatal visit.  Plan   pregnancy1  Problems (from 11/23/18 to present)    No problems associated with this episode.    Dating scan today shows singleton IUP with size equal to LMP dating and FHR 174 bpm. Results reviewed with patient.  Please refer to After Visit Summary for other counseling  recommendations.   Return in about 3 weeks (around 02/19/2019) for ROB with first trimester screen/NT scan.  Avel Sensor, CNM 01/29/2019  3:31 PM

## 2019-02-19 ENCOUNTER — Ambulatory Visit (INDEPENDENT_AMBULATORY_CARE_PROVIDER_SITE_OTHER): Payer: Managed Care, Other (non HMO)

## 2019-02-19 ENCOUNTER — Other Ambulatory Visit: Payer: Self-pay

## 2019-02-19 ENCOUNTER — Encounter: Payer: Self-pay | Admitting: Obstetrics and Gynecology

## 2019-02-19 ENCOUNTER — Ambulatory Visit (INDEPENDENT_AMBULATORY_CARE_PROVIDER_SITE_OTHER): Payer: Managed Care, Other (non HMO) | Admitting: Obstetrics and Gynecology

## 2019-02-19 VITALS — BP 112/62 | Wt 142.0 lb

## 2019-02-19 DIAGNOSIS — Z1379 Encounter for other screening for genetic and chromosomal anomalies: Secondary | ICD-10-CM

## 2019-02-19 DIAGNOSIS — Z369 Encounter for antenatal screening, unspecified: Secondary | ICD-10-CM

## 2019-02-19 DIAGNOSIS — Z3682 Encounter for antenatal screening for nuchal translucency: Secondary | ICD-10-CM

## 2019-02-19 DIAGNOSIS — Z3A12 12 weeks gestation of pregnancy: Secondary | ICD-10-CM

## 2019-02-19 DIAGNOSIS — Z3490 Encounter for supervision of normal pregnancy, unspecified, unspecified trimester: Secondary | ICD-10-CM

## 2019-02-19 DIAGNOSIS — Z3401 Encounter for supervision of normal first pregnancy, first trimester: Secondary | ICD-10-CM

## 2019-02-19 NOTE — Progress Notes (Signed)
ROB/NT scan  No concerns

## 2019-02-19 NOTE — Progress Notes (Signed)
    Routine Prenatal Care Visit  Subjective  Maria [name] is a 28 y.o. G1P0000 at [redacted]w[redacted]d being seen today for ongoing prenatal care.  She is currently monitored for the following issues for this low-risk pregnancy and has Encounter for supervision of low-risk pregnancy, antepartum on their problem list.  ----------------------------------------------------------------------------------- Patient reports no complaints.   Contractions: Not present. Vag. Bleeding: None.  Movement: Absent. Denies leaking of fluid.  ----------------------------------------------------------------------------------- The following portions of the patient's history were reviewed and updated as appropriate: allergies, current medications, past family history, past medical history, past social history, past surgical history and problem list. Problem list updated.   Objective  Blood pressure 112/62, weight 142 lb (64.4 kg), last menstrual period 11/23/2018. Pregravid weight 130 lb (59 kg) Total Weight Gain 12 lb (5.443 kg) Urinalysis:      Fetal Status: Fetal Heart Rate (bpm): 154   Movement: Absent     General:  Alert, oriented and cooperative. Patient is in no acute distress.  Skin: Skin is warm and dry. No rash noted.   Cardiovascular: Normal heart rate noted  Respiratory: Normal respiratory effort, no problems with respiration noted  Abdomen: Soft, gravid, appropriate for gestational age. Pain/Pressure: Absent     Pelvic:  Cervical exam deferred        Extremities: Normal range of motion.  Edema: None  Mental Status: Normal mood and affect. Normal behavior. Normal judgment and thought content.     Assessment   28 y.o. G1P0000 at [redacted]w[redacted]d by  08/30/2019, by Last Menstrual Period presenting for routine prenatal visit  Plan   pregnancy1  Problems (from 11/23/18 to present)    Problem Noted Resolved   Encounter for supervision of low-risk pregnancy, antepartum 01/08/2019 by Gae Dry, MD No   Overview  Addendum 02/19/2019  2:38 PM by Homero Fellers, MD    Clinic [facility] Prenatal Labs  Dating LMP/ [place] Blood type: O/Positive/-- (06/15 1534)   Genetic Screen NIPS: Antibody:Positive, See Final Results (06/15 1534)  Anatomic [place]  Rubella: 4.23 (06/15 1534) Varicella: Non-immune  GTT Third trimester:  RPR: Non Reactive (06/15 1534)   Rhogam  not needed HBsAg: Negative (06/15 1534)   TDaP vaccine                       Flu Shot: HIV: Non Reactive (06/15 1534)   Baby Food                                GBS:   Contraception  Pap: 07/2018  CBB  NO   CS/VBAC NO   Support Person Ricky               Gestational age appropriate obstetric precautions including but not limited to vaginal bleeding, contractions, leaking of fluid and fetal movement were reviewed in detail with the patient.    Had NT [place] then said she would like a maternit21 test. Discussed cost and indications and she desired to proceed. Okay for result through mychart.   Return in about 4 weeks (around 03/19/2019) for [place] telephone.  Homero Fellers MD [facility] 02/19/2019, 2:38 PM

## 2019-02-22 ENCOUNTER — Encounter (HOSPITAL_COMMUNITY): Payer: Self-pay

## 2019-02-23 ENCOUNTER — Telehealth: Payer: Self-pay

## 2019-02-23 NOTE — Telephone Encounter (Signed)
Notified results not ready. Genetic testing can take up to 2 weeks for results.

## 2019-02-23 NOTE — Telephone Encounter (Signed)
Patient inquiring about results of MaterniT21 done 02/19/2019. WG#[phone]

## 2019-02-24 LAB — MATERNIT21 PLUS CORE+SCA
Fetal Fraction: 13
Monosomy X (Turner Syndrome): NOT DETECTED
Result (T21): NEGATIVE
Trisomy 13 (Patau syndrome): NEGATIVE
Trisomy 18 (Edwards syndrome): NEGATIVE
Trisomy 21 (Down syndrome): NEGATIVE
XXX (Triple X Syndrome): NOT DETECTED
XXY (Klinefelter Syndrome): NOT DETECTED
XYY (Jacobs Syndrome): NOT DETECTED

## 2019-02-26 NOTE — Progress Notes (Signed)
Normal xx, released to [employer]

## 2019-03-19 ENCOUNTER — Ambulatory Visit (INDEPENDENT_AMBULATORY_CARE_PROVIDER_SITE_OTHER): Payer: Managed Care, Other (non HMO) | Admitting: Certified Nurse Midwife

## 2019-03-19 ENCOUNTER — Other Ambulatory Visit: Payer: Self-pay

## 2019-03-19 VITALS — BP 100/60 | Wt 141.8 lb

## 2019-03-19 DIAGNOSIS — Z3490 Encounter for supervision of normal pregnancy, unspecified, unspecified trimester: Secondary | ICD-10-CM

## 2019-03-19 DIAGNOSIS — Z3A16 16 weeks gestation of pregnancy: Secondary | ICD-10-CM

## 2019-03-19 DIAGNOSIS — Z1379 Encounter for other screening for genetic and chromosomal anomalies: Secondary | ICD-10-CM

## 2019-03-19 DIAGNOSIS — Z3492 Encounter for supervision of normal pregnancy, unspecified, second trimester: Secondary | ICD-10-CM

## 2019-03-19 DIAGNOSIS — Z363 Encounter for antenatal screening for malformations: Secondary | ICD-10-CM

## 2019-03-19 LAB — POCT URINALYSIS DIPSTICK OB
Glucose, UA: NEGATIVE
POC,PROTEIN,UA: NEGATIVE

## 2019-03-19 NOTE — Progress Notes (Signed)
ROB at 16wk4d: Doing well. Has felt some flutters. Taking PNV. MaterniT 21 was normal. Desires MSAFP.  FHT 153  P: MSAFP today  Anatomy scan and ROB in 3-4 weeks.  Dalia Heading, CNM

## 2019-03-21 LAB — AFP, SERUM, OPEN SPINA BIFIDA
AFP MoM: 1.82
AFP Value: 66.8 ng/mL
Gest. Age on Collection Date: 16.6 weeks
Maternal Age At EDD: 26.3 yr
OSBR Risk 1 IN: 1218
Test Results:: NEGATIVE
Weight: 141 [lb_av]

## 2019-04-16 ENCOUNTER — Encounter: Payer: Self-pay | Admitting: Advanced Practice Midwife

## 2019-04-16 ENCOUNTER — Other Ambulatory Visit: Payer: Self-pay

## 2019-04-16 ENCOUNTER — Ambulatory Visit (INDEPENDENT_AMBULATORY_CARE_PROVIDER_SITE_OTHER): Payer: Managed Care, Other (non HMO) | Admitting: Advanced Practice Midwife

## 2019-04-16 ENCOUNTER — Ambulatory Visit (INDEPENDENT_AMBULATORY_CARE_PROVIDER_SITE_OTHER): Payer: Managed Care, Other (non HMO)

## 2019-04-16 VITALS — BP 114/70 | Wt 145.0 lb

## 2019-04-16 DIAGNOSIS — Z363 Encounter for antenatal screening for malformations: Secondary | ICD-10-CM | POA: Diagnosis not present

## 2019-04-16 DIAGNOSIS — Z3490 Encounter for supervision of normal pregnancy, unspecified, unspecified trimester: Secondary | ICD-10-CM

## 2019-04-16 DIAGNOSIS — Z3A20 20 weeks gestation of pregnancy: Secondary | ICD-10-CM

## 2019-04-16 DIAGNOSIS — Z3402 Encounter for supervision of normal first pregnancy, second trimester: Secondary | ICD-10-CM

## 2019-04-16 NOTE — Progress Notes (Signed)
  Routine Prenatal Care Visit  Subjective  Maria [name] is a 28 y.o. G1P0000 at [redacted]w[redacted]d being seen today for ongoing prenatal care.  She is currently monitored for the following issues for this low-risk pregnancy and has Encounter for supervision of low-risk pregnancy, antepartum on their problem list.  ----------------------------------------------------------------------------------- Patient reports no complaints.    . Vag. Bleeding: None.  Movement: Present. Leaking Fluid denies.  ----------------------------------------------------------------------------------- The following portions of the patient's history were reviewed and updated as appropriate: allergies, current medications, past family history, past medical history, past social history, past surgical history and problem list. Problem list updated.  Objective  Blood pressure 114/70, weight 145 lb (65.8 kg), last menstrual period 11/23/2018. Pregravid weight 130 lb (59 kg) Total Weight Gain 15 lb (6.804 kg) Urinalysis: Urine Protein    Urine Glucose    Fetal Status: Fetal Heart Rate (bpm): 151 Fundal Height: 20 cm Movement: Present      Anatomy scan: complete, normal, female, breech, placenta posterior  General:  Alert, oriented and cooperative. Patient is in no acute distress.  Skin: Skin is warm and dry. No rash noted.   Cardiovascular: Normal heart rate noted  Respiratory: Normal respiratory effort, no problems with respiration noted  Abdomen: Soft, gravid, appropriate for gestational age. Pain/Pressure: Absent     Pelvic:  Cervical exam deferred        Extremities: Normal range of motion.  Edema: None  Mental Status: Normal mood and affect. Normal behavior. Normal judgment and thought content.   Assessment   28 y.o. G1P0000 at [redacted]w[redacted]d by  08/30/2019, by Last Menstrual Period presenting for routine prenatal visit  Plan   pregnancy1  Problems (from 11/23/18 to present)    Problem Noted Resolved   Encounter for  supervision of low-risk pregnancy, antepartum 01/08/2019 by Gae Dry, MD No   Overview Addendum 04/16/2019  3:46 PM by Rod Can, [place] Prenatal Labs  Dating LMP/ [place] Blood type: O/Positive/-- (06/15 1534)   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 MSAFP: Antibody:Positive, See Final Results (06/15 1534)  Anatomic [place]  Rubella: 4.23 (06/15 1534) Varicella: Non-immune  GTT Third trimester:  RPR: Non Reactive (06/15 1534)   Rhogam  not needed HBsAg: Negative (06/15 1534)   Vaccines TDAP:                       Flu Shot: 04/13/19 HIV: Non Reactive (06/15 1534)   Baby Food                                GBS:   Contraception  Pap: 07/2018  CBB  NO   CS/VBAC NO   Support Person Ricky               Preterm labor symptoms and general obstetric precautions including but not limited to vaginal bleeding, contractions, leaking of fluid and fetal movement were reviewed in detail with the patient. Please refer to After Visit Summary for other counseling recommendations.   Return in about 4 weeks (around 05/14/2019) for rob.  Rod Can, CNM 04/16/2019 3:46 PM

## 2019-04-16 NOTE — Progress Notes (Signed)
No vb. No lof. U/s today.  

## 2019-04-16 NOTE — Patient Instructions (Signed)

## 2019-05-14 ENCOUNTER — Ambulatory Visit (INDEPENDENT_AMBULATORY_CARE_PROVIDER_SITE_OTHER): Payer: Managed Care, Other (non HMO) | Admitting: Obstetrics and Gynecology

## 2019-05-14 ENCOUNTER — Other Ambulatory Visit: Payer: Self-pay

## 2019-05-14 ENCOUNTER — Encounter: Payer: Self-pay | Admitting: Obstetrics and Gynecology

## 2019-05-14 VITALS — BP 118/74 | Wt 150.0 lb

## 2019-05-14 DIAGNOSIS — Z3A24 24 weeks gestation of pregnancy: Secondary | ICD-10-CM

## 2019-05-14 DIAGNOSIS — Z3490 Encounter for supervision of normal pregnancy, unspecified, unspecified trimester: Secondary | ICD-10-CM

## 2019-05-14 DIAGNOSIS — Z3402 Encounter for supervision of normal first pregnancy, second trimester: Secondary | ICD-10-CM

## 2019-05-14 NOTE — Progress Notes (Signed)
    Routine Prenatal Care Visit  Subjective  Maria [name] is a 28 y.o. G1P0000 at [redacted]w[redacted]d being seen today for ongoing prenatal care.  She is currently monitored for the following issues for this low-risk pregnancy and has Encounter for supervision of low-risk pregnancy, antepartum on their problem list.  ----------------------------------------------------------------------------------- Patient reports no complaints.   Contractions: Not present. Vag. Bleeding: None.  Movement: Present. Denies leaking of fluid.  ----------------------------------------------------------------------------------- The following portions of the patient's history were reviewed and updated as appropriate: allergies, current medications, past family history, past medical history, past social history, past surgical history and problem list. Problem list updated.   Objective  Blood pressure 118/74, weight 150 lb (68 kg), last menstrual period 11/23/2018. Pregravid weight 130 lb (59 kg) Total Weight Gain 20 lb (9.072 kg) Urinalysis:      Fetal Status: Fetal Heart Rate (bpm): 145 Fundal Height: 23 cm Movement: Present     General:  Alert, oriented and cooperative. Patient is in no acute distress.  Skin: Skin is warm and dry. No rash noted.   Cardiovascular: Normal heart rate noted  Respiratory: Normal respiratory effort, no problems with respiration noted  Abdomen: Soft, gravid, appropriate for gestational age. Pain/Pressure: Absent     Pelvic:  Cervical exam deferred        Extremities: Normal range of motion.  Edema: None  Mental Status: Normal mood and affect. Normal behavior. Normal judgment and thought content.     Assessment   28 y.o. G1P0000 at [redacted]w[redacted]d by  08/30/2019, by Last Menstrual Period presenting for routine prenatal visit  Plan   pregnancy1  Problems (from 11/23/18 to present)    Problem Noted Resolved   Encounter for supervision of low-risk pregnancy, antepartum 01/08/2019 by Gae Dry,  MD No   Overview Addendum 05/14/2019  3:24 PM by Homero Fellers, MD    Clinic [facility] Prenatal Labs  Dating LMP/ [place] Blood type: O/Positive/-- (06/15 1534)   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 Antibody:Positive, See Final Results (06/15 1534)  Anatomic [place] complete Rubella: 4.23 (06/15 1534) Varicella: Non-immune  GTT Third trimester:  RPR: Non Reactive (06/15 1534)   Rhogam  not needed HBsAg: Negative (06/15 1534)   Vaccines TDAP:                       Flu Shot: 04/13/19 HIV: Non Reactive (06/15 1534)   Baby Food  Breast, but doesn't want to force it                              GBS:   Contraception  Pap: 07/2018  CBB  NO   CS/VBAC NO   Support Person Audry Pili               Gestational age appropriate obstetric precautions including but not limited to vaginal bleeding, contractions, leaking of fluid and fetal movement were reviewed in detail with the patient.    Discussed [email or web address] and breastfeeding  Return in about 3 weeks (around 06/04/2019) for ROB and 1 GTT in perosn.  Homero Fellers MD [facility] 05/14/2019, 3:24 PM

## 2019-05-14 NOTE — Progress Notes (Signed)
No vb. No lof.  

## 2019-05-14 NOTE — Patient Instructions (Addendum)
[email or web address]  Second Trimester of Pregnancy The second trimester is from week 14 through week 27 (months 4 through 6). The second trimester is often a time when you feel your best. Your body has adjusted to being pregnant, and you begin to feel better physically. Usually, morning sickness has lessened or quit completely, you may have more energy, and you may have an increase in appetite. The second trimester is also a time when the fetus is growing rapidly. At the end of the sixth month, the fetus is about 9 inches long and weighs about 1 pounds. You will likely begin to feel the baby move (quickening) between 16 and 20 weeks of pregnancy. Body changes during your second trimester Your body continues to go through many changes during your second trimester. The changes vary from woman to woman.  Your weight will continue to increase. You will notice your lower abdomen bulging out.  You may begin to get stretch marks on your hips, abdomen, and breasts.  You may develop headaches that can be relieved by medicines. The medicines should be approved by your health care provider.  You may urinate more often because the fetus is pressing on your bladder.  You may develop or continue to have heartburn as a result of your pregnancy.  You may develop constipation because certain hormones are causing the muscles that push waste through your intestines to slow down.  You may develop hemorrhoids or swollen, bulging veins (varicose veins).  You may have back pain. This is caused by: ? Weight gain. ? Pregnancy hormones that are relaxing the joints in your pelvis. ? A shift in weight and the muscles that support your balance.  Your breasts will continue to grow and they will continue to become tender.  Your gums may bleed and may be sensitive to brushing and flossing.  Dark spots or blotches (chloasma, mask of pregnancy) may develop on your face. This will likely fade after the baby is born.   A dark line from your belly button to the pubic area (linea nigra) may appear. This will likely fade after the baby is born.  You may have changes in your hair. These can include thickening of your hair, rapid growth, and changes in texture. Some women also have hair loss during or after pregnancy, or hair that feels dry or thin. Your hair will most likely return to normal after your baby is born. What to expect at prenatal visits During a routine prenatal visit:  You will be weighed to make sure you and the fetus are growing normally.  Your blood pressure will be taken.  Your abdomen will be measured to track your baby's growth.  The fetal heartbeat will be listened to.  Any test results from the previous visit will be discussed. Your health care provider may ask you:  How you are feeling.  If you are feeling the baby move.  If you have had any abnormal symptoms, such as leaking fluid, bleeding, severe headaches, or abdominal cramping.  If you are using any tobacco products, including cigarettes, chewing tobacco, and electronic cigarettes.  If you have any questions. Other tests that may be performed during your second trimester include:  Blood tests that check for: ? Low iron levels (anemia). ? High blood sugar that affects pregnant women (gestational diabetes) between 17 and 28 weeks. ? Rh antibodies. This is to check for a protein on red blood cells (Rh factor).  Urine tests to check for infections, diabetes, or protein  in the urine.  An ultrasound to confirm the proper growth and development of the baby.  An amniocentesis to check for possible genetic problems.  Fetal screens for spina bifida and Down syndrome.  HIV (human immunodeficiency virus) testing. Routine prenatal testing includes screening for HIV, unless you choose not to have this test. Follow these instructions at home: Medicines  Follow your health care provider's instructions regarding medicine use.  Specific medicines may be either safe or unsafe to take during pregnancy.  Take a prenatal vitamin that contains at least 600 micrograms (mcg) of folic acid.  If you develop constipation, try taking a stool softener if your health care provider approves. Eating and drinking   Eat a balanced diet that includes fresh fruits and vegetables, whole grains, good sources of protein such as meat, eggs, or tofu, and low-fat dairy. Your health care provider will help you determine the amount of weight gain that is right for you.  Avoid raw meat and uncooked cheese. These carry germs that can cause birth defects in the baby.  If you have low calcium intake from food, talk to your health care provider about whether you should take a daily calcium supplement.  Limit foods that are high in fat and processed sugars, such as fried and sweet foods.  To prevent constipation: ? Drink enough fluid to keep your urine clear or pale yellow. ? Eat foods that are high in fiber, such as fresh fruits and vegetables, whole grains, and beans. Activity  Exercise only as directed by your health care provider. Most women can continue their usual exercise routine during pregnancy. Try to exercise for 30 minutes at least 5 days a week. Stop exercising if you experience uterine contractions.  Avoid heavy lifting, wear low heel shoes, and practice good posture.  A sexual relationship may be continued unless your health care provider directs you otherwise. Relieving pain and discomfort  Wear a good support bra to prevent discomfort from breast tenderness.  Take warm sitz baths to soothe any pain or discomfort caused by hemorrhoids. Use hemorrhoid cream if your health care provider approves.  Rest with your legs elevated if you have leg cramps or low back pain.  If you develop varicose veins, wear support hose. Elevate your feet for 15 minutes, 3-4 times a day. Limit salt in your diet. Prenatal Care  Write down your  questions. Take them to your prenatal visits.  Keep all your prenatal visits as told by your health care provider. This is important. Safety  Wear your seat belt at all times when driving.  Make a list of emergency phone numbers, including numbers for family, friends, the hospital, and police and fire departments. General instructions  Ask your health care provider for a referral to a local prenatal education class. Begin classes no later than the beginning of month 6 of your pregnancy.  Ask for help if you have counseling or nutritional needs during pregnancy. Your health care provider can offer advice or refer you to specialists for help with various needs.  Do not use hot tubs, steam rooms, or saunas.  Do not douche or use tampons or scented sanitary pads.  Do not cross your legs for long periods of time.  Avoid cat litter boxes and soil used by cats. These carry germs that can cause birth defects in the baby and possibly loss of the fetus by miscarriage or stillbirth.  Avoid all smoking, herbs, alcohol, and unprescribed drugs. Chemicals in these products can affect  the formation and growth of the baby.  Do not use any products that contain nicotine or tobacco, such as cigarettes and e-cigarettes. If you need help quitting, ask your health care provider.  Visit your dentist if you have not gone yet during your pregnancy. Use a soft toothbrush to brush your teeth and be gentle when you floss. Contact a health care provider if:  You have dizziness.  You have mild pelvic cramps, pelvic pressure, or nagging pain in the abdominal area.  You have persistent nausea, vomiting, or diarrhea.  You have a bad smelling vaginal discharge.  You have pain when you urinate. Get help right away if:  You have a fever.  You are leaking fluid from your vagina.  You have spotting or bleeding from your vagina.  You have severe abdominal cramping or pain.  You have rapid weight gain or  weight loss.  You have shortness of breath with chest pain.  You notice sudden or extreme swelling of your face, hands, ankles, feet, or legs.  You have not felt your baby move in over an hour.  You have severe headaches that do not go away when you take medicine.  You have vision changes. Summary  The second trimester is from week 14 through week 27 (months 4 through 6). It is also a time when the fetus is growing rapidly.  Your body goes through many changes during pregnancy. The changes vary from woman to woman.  Avoid all smoking, herbs, alcohol, and unprescribed drugs. These chemicals affect the formation and growth your baby.  Do not use any tobacco products, such as cigarettes, chewing tobacco, and e-cigarettes. If you need help quitting, ask your health care provider.  Contact your health care provider if you have any questions. Keep all prenatal visits as told by your health care provider. This is important. This information is not intended to replace advice given to you by your health care provider. Make sure you discuss any questions you have with your health care provider. Document Released: 07/06/2001 Document Revised: 11/03/2018 Document Reviewed: 08/17/2016 Elsevier Patient Education  2020 [employer].

## 2019-05-31 ENCOUNTER — Telehealth: Payer: Self-pay

## 2019-05-31 ENCOUNTER — Other Ambulatory Visit: Payer: Self-pay | Admitting: Obstetrics and Gynecology

## 2019-05-31 ENCOUNTER — Ambulatory Visit (INDEPENDENT_AMBULATORY_CARE_PROVIDER_SITE_OTHER): Payer: Managed Care, Other (non HMO)

## 2019-05-31 ENCOUNTER — Telehealth: Payer: Managed Care, Other (non HMO)

## 2019-05-31 ENCOUNTER — Other Ambulatory Visit: Payer: Self-pay

## 2019-05-31 DIAGNOSIS — R350 Frequency of micturition: Secondary | ICD-10-CM | POA: Diagnosis not present

## 2019-05-31 LAB — POCT URINALYSIS DIPSTICK
Bilirubin, UA: NEGATIVE
Glucose, UA: NEGATIVE
Ketones, UA: NEGATIVE
Leukocytes, UA: NEGATIVE
Nitrite, UA: NEGATIVE
Protein, UA: NEGATIVE
Spec Grav, UA: 1.01 (ref 1.010–1.025)
Urobilinogen, UA: 0.2 E.U./dL
pH, UA: 5 (ref 5.0–8.0)

## 2019-05-31 NOTE — Telephone Encounter (Signed)
Order placed

## 2019-05-31 NOTE — Telephone Encounter (Signed)
Pt called triage reporting pain at her tail bone, she states the pain makes her feel like she needs to urinate, but when she goes its only a little that comes out, I had her drop off a urine sample, shows a trace of blood. Should we send a culture? Does she need to be seen? Please advise, and if urine culture needs to be ordered can you out it in.

## 2019-05-31 NOTE — Telephone Encounter (Signed)
Pt has pain at her tail bone states when she uses the bathroom only a little urine comes out, pt is on the nurse schedule so we can check for a uti

## 2019-05-31 NOTE — Telephone Encounter (Signed)
Please advise. I will put order in if needed

## 2019-05-31 NOTE — Telephone Encounter (Signed)
Pt aware we will call with results

## 2019-06-01 NOTE — Telephone Encounter (Signed)
Pt calling for urine culture results. Aware they are not back yet and that we will call her once they are back.

## 2019-06-02 LAB — URINE CULTURE

## 2019-06-03 NOTE — Progress Notes (Signed)
WNL- released to mychart

## 2019-06-04 ENCOUNTER — Other Ambulatory Visit: Payer: Managed Care, Other (non HMO)

## 2019-06-04 ENCOUNTER — Encounter: Payer: Self-pay | Admitting: Maternal Newborn

## 2019-06-04 ENCOUNTER — Ambulatory Visit (INDEPENDENT_AMBULATORY_CARE_PROVIDER_SITE_OTHER): Payer: Managed Care, Other (non HMO) | Admitting: Maternal Newborn

## 2019-06-04 ENCOUNTER — Other Ambulatory Visit: Payer: Self-pay

## 2019-06-04 VITALS — BP 100/60 | Wt 153.0 lb

## 2019-06-04 DIAGNOSIS — Z3490 Encounter for supervision of normal pregnancy, unspecified, unspecified trimester: Secondary | ICD-10-CM

## 2019-06-04 DIAGNOSIS — Z3492 Encounter for supervision of normal pregnancy, unspecified, second trimester: Secondary | ICD-10-CM

## 2019-06-04 DIAGNOSIS — Z3A27 27 weeks gestation of pregnancy: Secondary | ICD-10-CM

## 2019-06-04 LAB — POCT URINALYSIS DIPSTICK OB
Glucose, UA: NEGATIVE
POC,PROTEIN,UA: NEGATIVE

## 2019-06-04 NOTE — Progress Notes (Signed)
    Routine Prenatal Care Visit  Subjective  Maria [name] is a 28 y.o. G1P0000 at [redacted]w[redacted]d being seen today for ongoing prenatal care.  She is currently monitored for the following issues for this low-risk pregnancy and has Encounter for supervision of low-risk pregnancy, antepartum on their problem list.  ----------------------------------------------------------------------------------- Patient reports some back pain on the right side of her lower back towards her tail bone with urinary frequency, negative urine culture. Sometimes wakes up on her back after falling asleep on her side.  Contractions: Not present. Vag. Bleeding: None.  Movement: Present. No leaking of fluid.  ----------------------------------------------------------------------------------- The following portions of the patient's history were reviewed and updated as appropriate: allergies, current medications, past family history, past medical history, past social history, past surgical history and problem list. Problem list updated.  Objective  Blood pressure 100/60, weight 153 lb (69.4 kg), last menstrual period 11/23/2018. Pregravid weight 130 lb (59 kg) Total Weight Gain 23 lb (10.4 kg) Urinalysis: Urine dipstick shows negative for glucose, protein.  Fetal Status: Fetal Heart Rate (bpm): 143 Fundal Height: 28 cm Movement: Present     General:  Alert, oriented and cooperative. Patient is in no acute distress.  Skin: Skin is warm and dry. No rash noted.   Cardiovascular: Normal heart rate noted  Respiratory: Normal respiratory effort, no problems with respiration noted  Abdomen: Soft, gravid, appropriate for gestational age. Pain/Pressure: Present     Pelvic:  Cervical exam deferred        Extremities: Normal range of motion.  Edema: None  Mental Status: Normal mood and affect. Normal behavior. Normal judgment and thought content.     Assessment   28 y.o. G1P0000 at [redacted]w[redacted]d, EDD 08/30/2019 by Last Menstrual Period  presenting for a routine prenatal visit.  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 Discussed that intermittent back pain can result from fetal positioning.  Please refer to After Visit Summary for other counseling recommendations.   Return in about 2 weeks (around 06/18/2019) for ROB.  Avel Sensor, CNM 06/04/2019  2:55 PM

## 2019-06-04 NOTE — Patient Instructions (Signed)
Third Trimester of Pregnancy The third trimester is from week 28 through week 40 (months 7 through 9). The third trimester is a time when the unborn baby (fetus) is growing rapidly. At the end of the ninth month, the fetus is about 20 inches in length and weighs 6-10 pounds. Body changes during your third trimester Your body will continue to go through many changes during pregnancy. The changes vary from woman to woman. During the third trimester:  Your weight will continue to increase. You can expect to gain 25-35 pounds (11-16 kg) by the end of the pregnancy.  You may begin to get stretch marks on your hips, abdomen, and breasts.  You may urinate more often because the fetus is moving lower into your pelvis and pressing on your bladder.  You may develop or continue to have heartburn. This is caused by increased hormones that slow down muscles in the digestive tract.  You may develop or continue to have constipation because increased hormones slow digestion and cause the muscles that push waste through your intestines to relax.  You may develop hemorrhoids. These are swollen veins (varicose veins) in the rectum that can itch or be painful.  You may develop swollen, bulging veins (varicose veins) in your legs.  You may have increased body aches in the pelvis, back, or thighs. This is due to weight gain and increased hormones that are relaxing your joints.  You may have changes in your hair. These can include thickening of your hair, rapid growth, and changes in texture. Some women also have hair loss during or after pregnancy, or hair that feels dry or thin. Your hair will most likely return to normal after your baby is born.  Your breasts will continue to grow and they will continue to become tender. A yellow fluid (colostrum) may leak from your breasts. This is the first milk you are producing for your baby.  Your belly button may stick out.  You may notice more swelling in your hands,  face, or ankles.  You may have increased tingling or numbness in your hands, arms, and legs. The skin on your belly may also feel numb.  You may feel short of breath because of your expanding uterus.  You may have more problems sleeping. This can be caused by the size of your belly, increased need to urinate, and an increase in your body's metabolism.  You may notice the fetus "dropping," or moving lower in your abdomen (lightening).  You may have increased vaginal discharge.  You may notice your joints feel loose and you may have pain around your pelvic bone. What to expect at prenatal visits You will have prenatal exams every 2 weeks until week 36. Then you will have weekly prenatal exams. During a routine prenatal visit:  You will be weighed to make sure you and the baby are growing normally.  Your blood pressure will be taken.  Your abdomen will be measured to track your baby's growth.  The fetal heartbeat will be listened to.  Any test results from the previous visit will be discussed.  You may have a cervical check near your due date to see if your cervix has softened or thinned (effaced).  You will be tested for Group B streptococcus. This happens between 35 and 37 weeks. Your health care provider may ask you:  What your birth plan is.  How you are feeling.  If you are feeling the baby move.  If you have had any abnormal   symptoms, such as leaking fluid, bleeding, severe headaches, or abdominal cramping.  If you are using any tobacco products, including cigarettes, chewing tobacco, and electronic cigarettes.  If you have any questions. Other tests or screenings that may be performed during your third trimester include:  Blood tests that check for low iron levels (anemia).  Fetal testing to check the health, activity level, and growth of the fetus. Testing is done if you have certain medical conditions or if there are problems during the pregnancy.  Nonstress test  (NST). This test checks the health of your baby to make sure there are no signs of problems, such as the baby not getting enough oxygen. During this test, a belt is placed around your belly. The baby is made to move, and its heart rate is monitored during movement. What is false labor? False labor is a condition in which you feel small, irregular tightenings of the muscles in the womb (contractions) that usually go away with rest, changing position, or drinking water. These are called Braxton Hicks contractions. Contractions may last for hours, days, or even weeks before true labor sets in. If contractions come at regular intervals, become more frequent, increase in intensity, or become painful, you should see your health care provider. What are the signs of labor?  Abdominal cramps.  Regular contractions that start at 10 minutes apart and become stronger and more frequent with time.  Contractions that start on the top of the uterus and spread down to the lower abdomen and back.  Increased pelvic pressure and dull back pain.  A watery or bloody mucus discharge that comes from the vagina.  Leaking of amniotic fluid. This is also known as your "water breaking." It could be a slow trickle or a gush. Let your health care provider know if it has a color or strange odor. If you have any of these signs, call your health care provider right away, even if it is before your due date. Follow these instructions at home: Medicines  Follow your health care provider's instructions regarding medicine use. Specific medicines may be either safe or unsafe to take during pregnancy.  Take a prenatal vitamin that contains at least 600 micrograms (mcg) of folic acid.  If you develop constipation, try taking a stool softener if your health care provider approves. Eating and drinking   Eat a balanced diet that includes fresh fruits and vegetables, whole grains, good sources of protein such as meat, eggs, or tofu,  and low-fat dairy. Your health care provider will help you determine the amount of weight gain that is right for you.  Avoid raw meat and uncooked cheese. These carry germs that can cause birth defects in the baby.  If you have low calcium intake from food, talk to your health care provider about whether you should take a daily calcium supplement.  Eat four or five small meals rather than three large meals a day.  Limit foods that are high in fat and processed sugars, such as fried and sweet foods.  To prevent constipation: ? Drink enough fluid to keep your urine clear or pale yellow. ? Eat foods that are high in fiber, such as fresh fruits and vegetables, whole grains, and beans. Activity  Exercise only as directed by your health care provider. Most women can continue their usual exercise routine during pregnancy. Try to exercise for 30 minutes at least 5 days a week. Stop exercising if you experience uterine contractions.  Avoid heavy lifting.  Do   not exercise in extreme heat or humidity, or at high altitudes.  Wear low-heel, comfortable shoes.  Practice good posture.  You may continue to have sex unless your health care provider tells you otherwise. Relieving pain and discomfort  Take frequent breaks and rest with your legs elevated if you have leg cramps or low back pain.  Take warm sitz baths to soothe any pain or discomfort caused by hemorrhoids. Use hemorrhoid cream if your health care provider approves.  Wear a good support bra to prevent discomfort from breast tenderness.  If you develop varicose veins: ? Wear support pantyhose or compression stockings as told by your healthcare provider. ? Elevate your feet for 15 minutes, 3-4 times a day. Prenatal care  Write down your questions. Take them to your prenatal visits.  Keep all your prenatal visits as told by your health care provider. This is important. Safety  Wear your seat belt at all times when driving.  Make  a list of emergency phone numbers, including numbers for family, friends, the hospital, and police and fire departments. General instructions  Avoid cat litter boxes and soil used by cats. These carry germs that can cause birth defects in the baby. If you have a cat, ask someone to clean the litter box for you.  Do not travel far distances unless it is absolutely necessary and only with the approval of your health care provider.  Do not use hot tubs, steam rooms, or saunas.  Do not drink alcohol.  Do not use any products that contain nicotine or tobacco, such as cigarettes and e-cigarettes. If you need help quitting, ask your health care provider.  Do not use any medicinal herbs or unprescribed drugs. These chemicals affect the formation and growth of the baby.  Do not douche or use tampons or scented sanitary pads.  Do not cross your legs for long periods of time.  To prepare for the arrival of your baby: ? Take prenatal classes to understand, practice, and ask questions about labor and delivery. ? Make a trial run to the hospital. ? Visit the hospital and tour the maternity area. ? Arrange for maternity or paternity leave through employers. ? Arrange for family and friends to take care of pets while you are in the hospital. ? Purchase a rear-facing car seat and make sure you know how to install it in your car. ? Pack your hospital bag. ? Prepare the baby's nursery. Make sure to remove all pillows and stuffed animals from the baby's crib to prevent suffocation.  Visit your dentist if you have not gone during your pregnancy. Use a soft toothbrush to brush your teeth and be gentle when you floss. Contact a health care provider if:  You are unsure if you are in labor or if your water has broken.  You become dizzy.  You have mild pelvic cramps, pelvic pressure, or nagging pain in your abdominal area.  You have lower back pain.  You have persistent nausea, vomiting, or diarrhea.   You have an unusual or bad smelling vaginal discharge.  You have pain when you urinate. Get help right away if:  Your water breaks before 37 weeks.  You have regular contractions less than 5 minutes apart before 37 weeks.  You have a fever.  You are leaking fluid from your vagina.  You have spotting or bleeding from your vagina.  You have severe abdominal pain or cramping.  You have rapid weight loss or weight gain.  You have  shortness of breath with chest pain.  You notice sudden or extreme swelling of your face, hands, ankles, feet, or legs.  Your baby makes fewer than 10 movements in 2 hours.  You have severe headaches that do not go away when you take medicine.  You have vision changes. Summary  The third trimester is from week 28 through week 40, months 7 through 9. The third trimester is a time when the unborn baby (fetus) is growing rapidly.  During the third trimester, your discomfort may increase as you and your baby continue to gain weight. You may have abdominal, leg, and back pain, sleeping problems, and an increased need to urinate.  During the third trimester your breasts will keep growing and they will continue to become tender. A yellow fluid (colostrum) may leak from your breasts. This is the first milk you are producing for your baby.  False labor is a condition in which you feel small, irregular tightenings of the muscles in the womb (contractions) that eventually go away. These are called Braxton Hicks contractions. Contractions may last for hours, days, or even weeks before true labor sets in.  Signs of labor can include: abdominal cramps; regular contractions that start at 10 minutes apart and become stronger and more frequent with time; watery or bloody mucus discharge that comes from the vagina; increased pelvic pressure and dull back pain; and leaking of amniotic fluid. This information is not intended to replace advice given to you by your health  care provider. Make sure you discuss any questions you have with your health care provider. Document Released: 07/06/2001 Document Revised: 11/02/2018 Document Reviewed: 08/17/2016 Elsevier Patient Education  2020 [employer]  

## 2019-06-05 LAB — 28 WEEK RH+PANEL
Basophils Absolute: 0 10*3/uL (ref 0.0–0.2)
Basos: 0 %
EOS (ABSOLUTE): 0.1 10*3/uL (ref 0.0–0.4)
Eos: 0 %
Gestational Diabetes Screen: 128 mg/dL (ref 65–139)
HIV Screen 4th Generation wRfx: NONREACTIVE
Hematocrit: 29.5 % — ABNORMAL LOW (ref 34.0–46.6)
Hemoglobin: 10 g/dL — ABNORMAL LOW (ref 11.1–15.9)
Immature Grans (Abs): 0 10*3/uL (ref 0.0–0.1)
Immature Granulocytes: 0 %
Lymphocytes Absolute: 1.9 10*3/uL (ref 0.7–3.1)
Lymphs: 16 %
MCH: 30.5 pg (ref 26.6–33.0)
MCHC: 33.9 g/dL (ref 31.5–35.7)
MCV: 90 fL (ref 79–97)
Monocytes Absolute: 0.4 10*3/uL (ref 0.1–0.9)
Monocytes: 4 %
Neutrophils Absolute: 9.5 10*3/uL — ABNORMAL HIGH (ref 1.4–7.0)
Neutrophils: 80 %
Platelets: 214 10*3/uL (ref 150–450)
RBC: 3.28 x10E6/uL — ABNORMAL LOW (ref 3.77–5.28)
RDW: 12.4 % (ref 11.7–15.4)
RPR Ser Ql: NONREACTIVE
WBC: 12 10*3/uL — ABNORMAL HIGH (ref 3.4–10.8)

## 2019-06-05 NOTE — Progress Notes (Signed)
WNL

## 2019-06-18 ENCOUNTER — Other Ambulatory Visit: Payer: Self-pay

## 2019-06-18 ENCOUNTER — Encounter: Payer: Self-pay | Admitting: Obstetrics & Gynecology

## 2019-06-18 ENCOUNTER — Ambulatory Visit (INDEPENDENT_AMBULATORY_CARE_PROVIDER_SITE_OTHER): Payer: Managed Care, Other (non HMO) | Admitting: Obstetrics & Gynecology

## 2019-06-18 VITALS — BP 100/60 | Wt 157.0 lb

## 2019-06-18 DIAGNOSIS — Z3A29 29 weeks gestation of pregnancy: Secondary | ICD-10-CM

## 2019-06-18 DIAGNOSIS — Z3493 Encounter for supervision of normal pregnancy, unspecified, third trimester: Secondary | ICD-10-CM

## 2019-06-18 DIAGNOSIS — Z3490 Encounter for supervision of normal pregnancy, unspecified, unspecified trimester: Secondary | ICD-10-CM

## 2019-06-18 LAB — POCT URINALYSIS DIPSTICK OB
Glucose, UA: NEGATIVE
POC,PROTEIN,UA: NEGATIVE

## 2019-06-18 NOTE — Addendum Note (Signed)
Addended by: Quintella Baton D on: 06/18/2019 03:18 PM   Modules accepted: Orders

## 2019-06-18 NOTE — Patient Instructions (Signed)
Third Trimester of Pregnancy The third trimester is from week 28 through week 40 (months 7 through 9). The third trimester is a time when the unborn baby (fetus) is growing rapidly. At the end of the ninth month, the fetus is about 20 inches in length and weighs 6-10 pounds. Body changes during your third trimester Your body will continue to go through many changes during pregnancy. The changes vary from woman to woman. During the third trimester:  Your weight will continue to increase. You can expect to gain 25-35 pounds (11-16 kg) by the end of the pregnancy.  You may begin to get stretch marks on your hips, abdomen, and breasts.  You may urinate more often because the fetus is moving lower into your pelvis and pressing on your bladder.  You may develop or continue to have heartburn. This is caused by increased hormones that slow down muscles in the digestive tract.  You may develop or continue to have constipation because increased hormones slow digestion and cause the muscles that push waste through your intestines to relax.  You may develop hemorrhoids. These are swollen veins (varicose veins) in the rectum that can itch or be painful.  You may develop swollen, bulging veins (varicose veins) in your legs.  You may have increased body aches in the pelvis, back, or thighs. This is due to weight gain and increased hormones that are relaxing your joints.  You may have changes in your hair. These can include thickening of your hair, rapid growth, and changes in texture. Some women also have hair loss during or after pregnancy, or hair that feels dry or thin. Your hair will most likely return to normal after your baby is born.  Your breasts will continue to grow and they will continue to become tender. A yellow fluid (colostrum) may leak from your breasts. This is the first milk you are producing for your baby.  Your belly button may stick out.  You may notice more swelling in your hands,  face, or ankles.  You may have increased tingling or numbness in your hands, arms, and legs. The skin on your belly may also feel numb.  You may feel short of breath because of your expanding uterus.  You may have more problems sleeping. This can be caused by the size of your belly, increased need to urinate, and an increase in your body's metabolism.  You may notice the fetus "dropping," or moving lower in your abdomen (lightening).  You may have increased vaginal discharge.  You may notice your joints feel loose and you may have pain around your pelvic bone. What to expect at prenatal visits You will have prenatal exams every 2 weeks until week 36. Then you will have weekly prenatal exams. During a routine prenatal visit:  You will be weighed to make sure you and the baby are growing normally.  Your blood pressure will be taken.  Your abdomen will be measured to track your baby's growth.  The fetal heartbeat will be listened to.  Any test results from the previous visit will be discussed.  You may have a cervical check near your due date to see if your cervix has softened or thinned (effaced).  You will be tested for Group B streptococcus. This happens between 35 and 37 weeks. Your health care provider may ask you:  What your birth plan is.  How you are feeling.  If you are feeling the baby move.  If you have had any abnormal   symptoms, such as leaking fluid, bleeding, severe headaches, or abdominal cramping.  If you are using any tobacco products, including cigarettes, chewing tobacco, and electronic cigarettes.  If you have any questions. Other tests or screenings that may be performed during your third trimester include:  Blood tests that check for low iron levels (anemia).  Fetal testing to check the health, activity level, and growth of the fetus. Testing is done if you have certain medical conditions or if there are problems during the pregnancy.  Nonstress test  (NST). This test checks the health of your baby to make sure there are no signs of problems, such as the baby not getting enough oxygen. During this test, a belt is placed around your belly. The baby is made to move, and its heart rate is monitored during movement. What is false labor? False labor is a condition in which you feel small, irregular tightenings of the muscles in the womb (contractions) that usually go away with rest, changing position, or drinking water. These are called Braxton Hicks contractions. Contractions may last for hours, days, or even weeks before true labor sets in. If contractions come at regular intervals, become more frequent, increase in intensity, or become painful, you should see your health care provider. What are the signs of labor?  Abdominal cramps.  Regular contractions that start at 10 minutes apart and become stronger and more frequent with time.  Contractions that start on the top of the uterus and spread down to the lower abdomen and back.  Increased pelvic pressure and dull back pain.  A watery or bloody mucus discharge that comes from the vagina.  Leaking of amniotic fluid. This is also known as your "water breaking." It could be a slow trickle or a gush. Let your health care provider know if it has a color or strange odor. If you have any of these signs, call your health care provider right away, even if it is before your due date. Follow these instructions at home: Medicines  Follow your health care provider's instructions regarding medicine use. Specific medicines may be either safe or unsafe to take during pregnancy.  Take a prenatal vitamin that contains at least 600 micrograms (mcg) of folic acid.  If you develop constipation, try taking a stool softener if your health care provider approves. Eating and drinking   Eat a balanced diet that includes fresh fruits and vegetables, whole grains, good sources of protein such as meat, eggs, or tofu,  and low-fat dairy. Your health care provider will help you determine the amount of weight gain that is right for you.  Avoid raw meat and uncooked cheese. These carry germs that can cause birth defects in the baby.  If you have low calcium intake from food, talk to your health care provider about whether you should take a daily calcium supplement.  Eat four or five small meals rather than three large meals a day.  Limit foods that are high in fat and processed sugars, such as fried and sweet foods.  To prevent constipation: ? Drink enough fluid to keep your urine clear or pale yellow. ? Eat foods that are high in fiber, such as fresh fruits and vegetables, whole grains, and beans. Activity  Exercise only as directed by your health care provider. Most women can continue their usual exercise routine during pregnancy. Try to exercise for 30 minutes at least 5 days a week. Stop exercising if you experience uterine contractions.  Avoid heavy lifting.  Do   not exercise in extreme heat or humidity, or at high altitudes.  Wear low-heel, comfortable shoes.  Practice good posture.  You may continue to have sex unless your health care provider tells you otherwise. Relieving pain and discomfort  Take frequent breaks and rest with your legs elevated if you have leg cramps or low back pain.  Take warm sitz baths to soothe any pain or discomfort caused by hemorrhoids. Use hemorrhoid cream if your health care provider approves.  Wear a good support bra to prevent discomfort from breast tenderness.  If you develop varicose veins: ? Wear support pantyhose or compression stockings as told by your healthcare provider. ? Elevate your feet for 15 minutes, 3-4 times a day. Prenatal care  Write down your questions. Take them to your prenatal visits.  Keep all your prenatal visits as told by your health care provider. This is important. Safety  Wear your seat belt at all times when driving.  Make  a list of emergency phone numbers, including numbers for family, friends, the hospital, and police and fire departments. General instructions  Avoid cat litter boxes and soil used by cats. These carry germs that can cause birth defects in the baby. If you have a cat, ask someone to clean the litter box for you.  Do not travel far distances unless it is absolutely necessary and only with the approval of your health care provider.  Do not use hot tubs, steam rooms, or saunas.  Do not drink alcohol.  Do not use any products that contain nicotine or tobacco, such as cigarettes and e-cigarettes. If you need help quitting, ask your health care provider.  Do not use any medicinal herbs or unprescribed drugs. These chemicals affect the formation and growth of the baby.  Do not douche or use tampons or scented sanitary pads.  Do not cross your legs for long periods of time.  To prepare for the arrival of your baby: ? Take prenatal classes to understand, practice, and ask questions about labor and delivery. ? Make a trial run to the hospital. ? Visit the hospital and tour the maternity area. ? Arrange for maternity or paternity leave through employers. ? Arrange for family and friends to take care of pets while you are in the hospital. ? Purchase a rear-facing car seat and make sure you know how to install it in your car. ? Pack your hospital bag. ? Prepare the baby's nursery. Make sure to remove all pillows and stuffed animals from the baby's crib to prevent suffocation.  Visit your dentist if you have not gone during your pregnancy. Use a soft toothbrush to brush your teeth and be gentle when you floss. Contact a health care provider if:  You are unsure if you are in labor or if your water has broken.  You become dizzy.  You have mild pelvic cramps, pelvic pressure, or nagging pain in your abdominal area.  You have lower back pain.  You have persistent nausea, vomiting, or diarrhea.   You have an unusual or bad smelling vaginal discharge.  You have pain when you urinate. Get help right away if:  Your water breaks before 37 weeks.  You have regular contractions less than 5 minutes apart before 37 weeks.  You have a fever.  You are leaking fluid from your vagina.  You have spotting or bleeding from your vagina.  You have severe abdominal pain or cramping.  You have rapid weight loss or weight gain.  You have  shortness of breath with chest pain.  You notice sudden or extreme swelling of your face, hands, ankles, feet, or legs.  Your baby makes fewer than 10 movements in 2 hours.  You have severe headaches that do not go away when you take medicine.  You have vision changes. Summary  The third trimester is from week 28 through week 40, months 7 through 9. The third trimester is a time when the unborn baby (fetus) is growing rapidly.  During the third trimester, your discomfort may increase as you and your baby continue to gain weight. You may have abdominal, leg, and back pain, sleeping problems, and an increased need to urinate.  During the third trimester your breasts will keep growing and they will continue to become tender. A yellow fluid (colostrum) may leak from your breasts. This is the first milk you are producing for your baby.  False labor is a condition in which you feel small, irregular tightenings of the muscles in the womb (contractions) that eventually go away. These are called Braxton Hicks contractions. Contractions may last for hours, days, or even weeks before true labor sets in.  Signs of labor can include: abdominal cramps; regular contractions that start at 10 minutes apart and become stronger and more frequent with time; watery or bloody mucus discharge that comes from the vagina; increased pelvic pressure and dull back pain; and leaking of amniotic fluid. This information is not intended to replace advice given to you by your health  care provider. Make sure you discuss any questions you have with your health care provider. Document Released: 07/06/2001 Document Revised: 11/02/2018 Document Reviewed: 08/17/2016 Elsevier Patient Education  2020 [employer].

## 2019-06-18 NOTE — Progress Notes (Signed)
  Subjective  Fetal Movement? yes Contractions? no Leaking Fluid? no Vaginal Bleeding? no  Objective  BP 100/60   Wt 157 lb (71.2 kg)   LMP 11/23/2018   BMI 26.95 kg/m  General: NAD Pumonary: no increased work of breathing Abdomen: gravid, non-tender Extremities: no edema Psychiatric: mood appropriate, affect full  Assessment  28 y.o. G1P0000 at [redacted]w[redacted]d by  08/30/2019, by Last Menstrual Period presenting for routine prenatal visit  Plan   Problem List Items Addressed This Visit      Other   Encounter for supervision of low-risk pregnancy, antepartum    Other Visit Diagnoses    [redacted] weeks gestation of pregnancy    -  Primary      pregnancy1  Problems (from 11/23/18 to present)    Problem Noted Resolved   Encounter for supervision of low-risk pregnancy, antepartum 01/08/2019 by Gae Dry, MD No   Overview Addendum 06/18/2019  3:12 PM by Gae Dry, MD    [facility] Prenatal Labs  Dating LMP/ [place] Blood type: O/Positive/-- (06/15 1534)   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 Antibody:Positive, See Final Results (06/15 1534)  Anatomic [place] complete Rubella: 4.23 (06/15 1534) Varicella: Non-immune  GTT Third trimester:  RPR: Non Reactive (06/15 1534)   Rhogam  not needed HBsAg: Negative (06/15 1534)   Vaccines TDAP:  10/13/17              Flu Shot: 04/13/19 HIV: Non Reactive (06/15 1534)   Baby Food  Breast, but doesn't want to force it                              GBS:   Contraception IUD Pap: 07/2018  CBB  NO   CS/VBAC NO   Support Person Ricky             The following were addressed during this visit:  Breastfeeding Education - Early initiation of breastfeeding  - Risks of giving your baby anything other than breast milk if you are breastfeeding  - Exclusive breastfeeding for the first 6 months   28-31 weeks - Continuing Work / Programmer, multimedia  Covid risks and work and pregnancy discussed  - PNV, [place] - No TDap as has received within the past 2 years  Barnett Applebaum, MD, [facility], [facility] 06/18/2019  3:13 PM

## 2019-06-21 ENCOUNTER — Encounter: Payer: Self-pay | Admitting: Physician Assistant

## 2019-06-21 ENCOUNTER — Encounter: Payer: Self-pay | Admitting: Obstetrics & Gynecology

## 2019-06-21 ENCOUNTER — Encounter: Payer: Self-pay | Admitting: Obstetrics and Gynecology

## 2019-07-05 ENCOUNTER — Ambulatory Visit (INDEPENDENT_AMBULATORY_CARE_PROVIDER_SITE_OTHER): Payer: Managed Care, Other (non HMO) | Admitting: Certified Nurse Midwife

## 2019-07-05 ENCOUNTER — Other Ambulatory Visit: Payer: Self-pay

## 2019-07-05 VITALS — BP 120/70 | Temp 97.1°F | Wt 158.0 lb

## 2019-07-05 DIAGNOSIS — Z3A32 32 weeks gestation of pregnancy: Secondary | ICD-10-CM

## 2019-07-05 DIAGNOSIS — Z3490 Encounter for supervision of normal pregnancy, unspecified, unspecified trimester: Secondary | ICD-10-CM

## 2019-07-05 DIAGNOSIS — Z3493 Encounter for supervision of normal pregnancy, unspecified, third trimester: Secondary | ICD-10-CM

## 2019-07-05 LAB — POCT URINALYSIS DIPSTICK OB
Glucose, UA: NEGATIVE
POC,PROTEIN,UA: NEGATIVE

## 2019-07-05 NOTE — Progress Notes (Signed)
No concerns.rj 

## 2019-07-05 NOTE — Progress Notes (Signed)
ROB at 32 weeks: Doing well. No problems. Baby active. 28 week labs WNL FH 32 cm, FHT 130. BP 120/70  Discussed the following: Childbirth classes Preterm labor and preeclampsia precautions Pain control in labor  Given magazine on preparing for childbirth RTO 2 weeks  Dalia Heading, CNM

## 2019-07-19 ENCOUNTER — Other Ambulatory Visit: Payer: Self-pay

## 2019-07-19 ENCOUNTER — Ambulatory Visit (INDEPENDENT_AMBULATORY_CARE_PROVIDER_SITE_OTHER): Payer: Managed Care, Other (non HMO) | Admitting: Obstetrics & Gynecology

## 2019-07-19 ENCOUNTER — Encounter: Payer: Self-pay | Admitting: Obstetrics & Gynecology

## 2019-07-19 VITALS — BP 110/70 | Wt 161.0 lb

## 2019-07-19 DIAGNOSIS — Z3403 Encounter for supervision of normal first pregnancy, third trimester: Secondary | ICD-10-CM

## 2019-07-19 DIAGNOSIS — Z3490 Encounter for supervision of normal pregnancy, unspecified, unspecified trimester: Secondary | ICD-10-CM

## 2019-07-19 DIAGNOSIS — Z3A34 34 weeks gestation of pregnancy: Secondary | ICD-10-CM

## 2019-07-19 LAB — POCT URINALYSIS DIPSTICK OB
Glucose, UA: NEGATIVE
POC,PROTEIN,UA: NEGATIVE

## 2019-07-19 NOTE — Progress Notes (Signed)
  Subjective  Fetal Movement? yes Contractions? no Leaking Fluid? no Vaginal Bleeding? no  Objective  BP 110/70   Wt 161 lb (73 kg)   LMP 11/23/2018   BMI 27.64 kg/m  General: NAD Pumonary: no increased work of breathing Abdomen: gravid, non-tender Extremities: no edema Psychiatric: mood appropriate, affect full  Assessment  28 y.o. G1P0000 at [redacted]w[redacted]d by  08/30/2019, by Last Menstrual Period presenting for routine prenatal visit  Plan   Problem List Items Addressed This Visit      Other   Encounter for supervision of low-risk pregnancy, antepartum    Other Visit Diagnoses    [redacted] weeks gestation of pregnancy    -  Primary      pregnancy1  Problems (from 11/23/18 to present)    Problem Noted Resolved   Encounter for supervision of low-risk pregnancy, antepartum 01/08/2019 by Gae Dry, MD No   Overview Addendum 06/18/2019  3:12 PM by Gae Dry, MD    [facility] Prenatal Labs  Dating LMP/ [place] Blood type: O/Positive/-- (06/15 1534)   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

## 2019-07-19 NOTE — Patient Instructions (Signed)
Braxton Hicks Contractions Contractions of the uterus can occur throughout pregnancy, but they are not always a sign that you are in labor. You may have practice contractions called Braxton Hicks contractions. These false labor contractions are sometimes confused with true labor. What are Braxton Hicks contractions? Braxton Hicks contractions are tightening movements that occur in the muscles of the uterus before labor. Unlike true labor contractions, these contractions do not result in opening (dilation) and thinning of the cervix. Toward the end of pregnancy (32-34 weeks), Braxton Hicks contractions can happen more often and may become stronger. These contractions are sometimes difficult to tell apart from true labor because they can be very uncomfortable. You should not feel embarrassed if you go to the hospital with false labor. Sometimes, the only way to tell if you are in true labor is for your health care provider to look for changes in the cervix. The health care provider will do a physical exam and may monitor your contractions. If you are not in true labor, the exam should show that your cervix is not dilating and your water has not broken. If there are no other health problems associated with your pregnancy, it is completely safe for you to be sent home with false labor. You may continue to have Braxton Hicks contractions until you go into true labor. How to tell the difference between true labor and false labor True labor  Contractions last 30-70 seconds.  Contractions become very regular.  Discomfort is usually felt in the top of the uterus, and it spreads to the lower abdomen and low back.  Contractions do not go away with walking.  Contractions usually become more intense and increase in frequency.  The cervix dilates and gets thinner. False labor  Contractions are usually shorter and not as strong as true labor contractions.  Contractions are usually irregular.  Contractions  are often felt in the front of the lower abdomen and in the groin.  Contractions may go away when you walk around or change positions while lying down.  Contractions get weaker and are shorter-lasting as time goes on.  The cervix usually does not dilate or become thin. Follow these instructions at home:   Take over-the-counter and prescription medicines only as told by your health care provider.  Keep up with your usual exercises and follow other instructions from your health care provider.  Eat and drink lightly if you think you are going into labor.  If Braxton Hicks contractions are making you uncomfortable: ? Change your position from lying down or resting to walking, or change from walking to resting. ? Sit and rest in a tub of warm water. ? Drink enough fluid to keep your urine pale yellow. Dehydration may cause these contractions. ? Do slow and deep breathing several times an hour.  Keep all follow-up prenatal visits as told by your health care provider. This is important. Contact a health care provider if:  You have a fever.  You have continuous pain in your abdomen. Get help right away if:  Your contractions become stronger, more regular, and closer together.  You have fluid leaking or gushing from your vagina.  You pass blood-tinged mucus (bloody show).  You have bleeding from your vagina.  You have low back pain that you never had before.  You feel your baby's head pushing down and causing pelvic pressure.  Your baby is not moving inside you as much as it used to. Summary  Contractions that occur before labor are   called Braxton Hicks contractions, false labor, or practice contractions.  Braxton Hicks contractions are usually shorter, weaker, farther apart, and less regular than true labor contractions. True labor contractions usually become progressively stronger and regular, and they become more frequent.  Manage discomfort from [facility] contractions  by changing position, resting in a warm bath, drinking plenty of water, or practicing deep breathing. This information is not intended to replace advice given to you by your health care provider. Make sure you discuss any questions you have with your health care provider. Document Released: 11/25/2016 Document Revised: 06/24/2017 Document Reviewed: 11/25/2016 Elsevier Patient Education  2020 [employer].

## 2019-07-19 NOTE — Addendum Note (Signed)
Addended by: Quintella Baton D on: 07/19/2019 11:20 AM   Modules accepted: Orders

## 2019-07-27 NOTE — L&D Delivery Note (Signed)
Delivery Note At 3:37 AM a viable female was delivered via Vaginal, Spontaneous (Presentation: Right Occiput Anterior).  APGAR: 8, 9; weight  pending.   Placenta status: Spontaneous, Intact.  Cord: 3 vessels with the following complications: None.  Cord pH: N/A  Anesthesia: Epidural Episiotomy: None Lacerations: 2nd degree Suture Repair: 2-0 Vicryl and 3-0 Monocryl Est. Blood Loss (mL):  421mL  Mom to postpartum.  Baby to Couplet care / Skin to Skin.  Malachy Mood 08/14/2019, 4:04 AM

## 2019-07-31 ENCOUNTER — Telehealth: Payer: Self-pay

## 2019-07-31 NOTE — Telephone Encounter (Signed)
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  Is it safe for her to still be working in this environment?  Does she need to get tested?  Can she have a doctor's note if she needs to stop working d/t this covid outbreak in the center?  [phone]

## 2019-07-31 NOTE — Telephone Encounter (Signed)
LMTC

## 2019-07-31 NOTE — Telephone Encounter (Signed)
Social distancing is recommended, and if there are cases active in her work place then she should stay away from them or from work itself.  We can provide letter to support that.  If she has directly been exposed, then she needs to self quarantine at home for 14 days from the date of exposure.

## 2019-08-06 ENCOUNTER — Other Ambulatory Visit: Payer: Self-pay

## 2019-08-06 ENCOUNTER — Ambulatory Visit (INDEPENDENT_AMBULATORY_CARE_PROVIDER_SITE_OTHER): Payer: Managed Care, Other (non HMO) | Admitting: Obstetrics and Gynecology

## 2019-08-06 ENCOUNTER — Other Ambulatory Visit (HOSPITAL_COMMUNITY)
Admission: RE | Admit: 2019-08-06 | Discharge: 2019-08-06 | Disposition: A | Discharge status: Home / Self Care | Payer: Managed Care, Other (non HMO) | Source: Ambulatory Visit | Attending: Obstetrics and Gynecology | Admitting: Obstetrics and Gynecology

## 2019-08-06 ENCOUNTER — Encounter: Payer: Self-pay | Admitting: Obstetrics and Gynecology

## 2019-08-06 VITALS — BP 118/72 | Wt 164.0 lb

## 2019-08-06 DIAGNOSIS — Z3490 Encounter for supervision of normal pregnancy, unspecified, unspecified trimester: Secondary | ICD-10-CM | POA: Diagnosis present

## 2019-08-06 DIAGNOSIS — Z3A36 36 weeks gestation of pregnancy: Secondary | ICD-10-CM

## 2019-08-06 DIAGNOSIS — Z3403 Encounter for supervision of normal first pregnancy, third trimester: Secondary | ICD-10-CM

## 2019-08-06 NOTE — Addendum Note (Signed)
Addended by: Adrian Prows on: 08/06/2019 04:59 PM   Modules accepted: Orders

## 2019-08-06 NOTE — Progress Notes (Signed)
    Routine Prenatal Care Visit  Subjective  Maria [name] is a 29 y.o. G1P0000 at [redacted]w[redacted]d being seen today for ongoing prenatal care.  She is currently monitored for the following issues for this low-risk pregnancy and has Encounter for supervision of low-risk pregnancy, antepartum on their problem list.  ----------------------------------------------------------------------------------- Patient reports no complaints.   Contractions: Irregular. Vag. Bleeding: None.  Movement: Present. Denies leaking of fluid.  ----------------------------------------------------------------------------------- The following portions of the patient's history were reviewed and updated as appropriate: allergies, current medications, past family history, past medical history, past social history, past surgical history and problem list. Problem list updated.   Objective  Blood pressure 118/72, weight 164 lb (74.4 kg), last menstrual period 11/23/2018. Pregravid weight 130 lb (59 kg) Total Weight Gain 34 lb (15.4 kg) Urinalysis:      Fetal Status: Fetal Heart Rate (bpm): 127 Fundal Height: 35 cm Movement: Present  Presentation: Vertex  General:  Alert, oriented and cooperative. Patient is in no acute distress.  Skin: Skin is warm and dry. No rash noted.   Cardiovascular: Normal heart rate noted  Respiratory: Normal respiratory effort, no problems with respiration noted  Abdomen: Soft, gravid, appropriate for gestational age. Pain/Pressure: Absent     Pelvic:  Cervical exam deferred        Extremities: Normal range of motion.  Edema: None  Mental Status: Normal mood and affect. Normal behavior. Normal judgment and thought content.     Assessment   29 y.o. G1P0000 at [redacted]w[redacted]d by  08/30/2019, by Last Menstrual Period presenting for routine prenatal visit  Plan   pregnancy1  Problems (from 11/23/18 to present)    Problem Noted Resolved   Encounter for supervision of low-risk pregnancy, antepartum 01/08/2019 by  Gae Dry, MD No   Overview Addendum 06/18/2019  3:12 PM by Gae Dry, MD    [facility] Prenatal Labs  Dating LMP/ [place] Blood type: O/Positive/-- (06/15 1534)   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 Antibody:Positive, See Final Results (06/15 1534)  Anatomic [place] complete Rubella: 4.23 (06/15 1534) Varicella: Non-immune  GTT Third trimester:  RPR: Non Reactive (06/15 1534)   Rhogam  not needed HBsAg: Negative (06/15 1534)   Vaccines TDAP:  10/13/17              Flu Shot: 04/13/19 HIV: Non Reactive (06/15 1534)   Baby Food  Breast, but doesn't want to force it                              GBS:   Contraception IUD Pap: 07/2018  CBB  NO   CS/VBAC NO   Support Person Audry Pili               Gestational age appropriate obstetric precautions including but not limited to vaginal bleeding, contractions, leaking of fluid and fetal movement were reviewed in detail with the patient.    GBS and GC/CT today  Return in about 1 week (around 08/13/2019) for ROB in person.  Homero Fellers MD [facility], [facility] 08/06/2019, 4:21 PM

## 2019-08-06 NOTE — Progress Notes (Signed)
ROB GBS/Aptima  C/o some braxton hicks, pelvic pressure Denies lof, no vb Good FM

## 2019-08-08 LAB — CERVICOVAGINAL ANCILLARY ONLY
Chlamydia: NEGATIVE
Comment: NEGATIVE
Comment: NORMAL
Neisseria Gonorrhea: NEGATIVE

## 2019-08-11 LAB — CULTURE, BETA STREP (GROUP B ONLY): Strep Gp B Culture: NEGATIVE

## 2019-08-13 ENCOUNTER — Inpatient Hospital Stay
Admission: EM | Admit: 2019-08-13 | Discharge: 2019-08-15 | DRG: 807 | Disposition: A | Discharge status: Home / Self Care | Payer: Managed Care, Other (non HMO) | Attending: Obstetrics and Gynecology | Admitting: Obstetrics and Gynecology

## 2019-08-13 ENCOUNTER — Encounter: Payer: Self-pay | Admitting: Obstetrics and Gynecology

## 2019-08-13 ENCOUNTER — Other Ambulatory Visit: Payer: Self-pay

## 2019-08-13 DIAGNOSIS — F909 Attention-deficit hyperactivity disorder, unspecified type: Secondary | ICD-10-CM | POA: Diagnosis present

## 2019-08-13 DIAGNOSIS — O99344 Other mental disorders complicating childbirth: Secondary | ICD-10-CM | POA: Diagnosis present

## 2019-08-13 DIAGNOSIS — Z20822 Contact with and (suspected) exposure to covid-19: Secondary | ICD-10-CM | POA: Diagnosis present

## 2019-08-13 DIAGNOSIS — Z3490 Encounter for supervision of normal pregnancy, unspecified, unspecified trimester: Secondary | ICD-10-CM

## 2019-08-13 DIAGNOSIS — F419 Anxiety disorder, unspecified: Secondary | ICD-10-CM | POA: Diagnosis present

## 2019-08-13 DIAGNOSIS — O365930 Maternal care for other known or suspected poor fetal growth, third trimester, not applicable or unspecified: Principal | ICD-10-CM | POA: Diagnosis present

## 2019-08-13 DIAGNOSIS — Z3A37 37 weeks gestation of pregnancy: Secondary | ICD-10-CM

## 2019-08-13 DIAGNOSIS — O26893 Other specified pregnancy related conditions, third trimester: Secondary | ICD-10-CM | POA: Diagnosis present

## 2019-08-13 LAB — RESPIRATORY PANEL BY RT PCR (FLU A&B, COVID)
Influenza A by PCR: NEGATIVE
Influenza B by PCR: NEGATIVE
SARS Coronavirus 2 by RT PCR: NEGATIVE

## 2019-08-13 LAB — CBC
HCT: 34.2 % — ABNORMAL LOW (ref 36.0–46.0)
Hemoglobin: 12.1 g/dL (ref 12.0–15.0)
MCH: 30.8 pg (ref 26.0–34.0)
MCHC: 35.4 g/dL (ref 30.0–36.0)
MCV: 87 fL (ref 80.0–100.0)
Platelets: 184 10*3/uL (ref 150–400)
RBC: 3.93 MIL/uL (ref 3.87–5.11)
RDW: 13.4 % (ref 11.5–15.5)
WBC: 16.2 10*3/uL — ABNORMAL HIGH (ref 4.0–10.5)
nRBC: 0 % (ref 0.0–0.2)

## 2019-08-13 MED ORDER — LACTATED RINGERS IV SOLN: 500.0000 mL | INTRAVENOUS | Status: DC | PRN

## 2019-08-13 MED ORDER — OXYTOCIN 10 UNIT/ML IJ SOLN
INTRAMUSCULAR | Status: AC
  Filled 2019-08-13: qty 2

## 2019-08-13 MED ORDER — ACETAMINOPHEN 325 MG PO TABS: 650.0000 mg | ORAL_TABLET | ORAL | Status: DC | PRN

## 2019-08-13 MED ORDER — MISOPROSTOL 200 MCG PO TABS
ORAL_TABLET | ORAL | Status: AC
  Filled 2019-08-13: qty 4

## 2019-08-13 MED ORDER — OXYTOCIN 40 UNITS IN NORMAL SALINE INFUSION - SIMPLE MED
2.5000 [IU]/h | INTRAVENOUS | Status: DC
  Administered 2019-08-14: 04:00:00 2.5 [IU]/h via INTRAVENOUS

## 2019-08-13 MED ORDER — AMMONIA AROMATIC IN INHA
RESPIRATORY_TRACT | Status: AC
  Filled 2019-08-13: qty 10

## 2019-08-13 MED ORDER — LIDOCAINE HCL (PF) 1 % IJ SOLN
INTRAMUSCULAR | Status: AC
  Filled 2019-08-13: qty 30

## 2019-08-13 MED ORDER — ONDANSETRON HCL 4 MG/2ML IJ SOLN: 4.0000 mg | Freq: Four times a day (QID) | INTRAMUSCULAR | Status: DC | PRN

## 2019-08-13 MED ORDER — BUTORPHANOL TARTRATE 1 MG/ML IJ SOLN
1.0000 mg | INTRAMUSCULAR | Status: DC | PRN
  Administered 2019-08-13: 1 mg via INTRAVENOUS
  Filled 2019-08-13: qty 1

## 2019-08-13 MED ORDER — LIDOCAINE HCL (PF) 1 % IJ SOLN: 30.0000 mL | INTRAMUSCULAR | Status: DC | PRN

## 2019-08-13 MED ORDER — OXYTOCIN BOLUS FROM INFUSION: 500.0000 mL | Freq: Once | INTRAVENOUS | Status: AC

## 2019-08-13 MED ORDER — LACTATED RINGERS IV SOLN: INTRAVENOUS | Status: DC

## 2019-08-13 MED ORDER — SOD CITRATE-CITRIC ACID 500-334 MG/5ML PO SOLN: 30.0000 mL | ORAL | Status: DC | PRN

## 2019-08-13 MED ORDER — OXYTOCIN 40 UNITS IN NORMAL SALINE INFUSION - SIMPLE MED
INTRAVENOUS | Status: AC
  Administered 2019-08-14: 500 mL via INTRAVENOUS
  Filled 2019-08-13: qty 1000

## 2019-08-13 NOTE — OB Triage Note (Signed)
Pt arrived to unit with complaints of contractions/lower back cramping that started around 1615 on 1/18 approximately every 5 min. Denies LOF of bleeding. +Fetal movement. Will continue to assess.

## 2019-08-13 NOTE — H&P (Signed)
Obstetric H&P   Chief Complaint: Contractions  Prenatal Care Provider: [facility]  History of Present Illness: 29 y.o. G1P0000 [redacted]w[redacted]d by 08/30/2019, by Last Menstrual Period presenting to L&D with contractions starting at about 18:45 today.  Patient with cervical change from 1 to 5cm in the course of 2-hr rule out.  +FM, no LOF, no VB.    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    Pregravid weight 59 kg Total Weight Gain 14.1 kg  pregnancy1  Problems (from 11/23/18 to present)    Problem Noted Resolved   Encounter for supervision of low-risk pregnancy, antepartum 01/08/2019 by Gae Dry, MD No   Overview Addendum 06/18/2019  3:12 PM by Gae Dry, MD    [facility] Prenatal Labs  Dating LMP/ [place] Blood type: O/Positive/-- (06/15 1534)   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 Antibody:Positive, See Final Results (06/15 1534)  Anatomic [place] complete Rubella: 4.23 (06/15 1534) Varicella: Non-immune  GTT Third trimester: 128 RPR: Non Reactive (06/15 1534)   Rhogam  not needed HBsAg: Negative (06/15 1534)   Vaccines TDAP:  10/13/17              Flu Shot: 04/13/19 HIV: Non Reactive (06/15 1534)   Baby Food  Breast, but doesn't want to force it                              GBS: negative  Contraception IUD Pap: 07/2018  CBB  NO   CS/VBAC NO   Support Person Ricky               Review of Systems: 10 point review of systems negative unless otherwise noted in HPI  Past Medical History: Past Medical History:  Diagnosis Date  . ADHD (attention deficit hyperactivity disorder)   . Anxiety     Past Surgical History: Past Surgical History:  Procedure Laterality Date  . LAPAROSCOPIC OVARIAN CYSTECTOMY Left 03/01/2016   Procedure: LAPAROSCOPIC OVARIAN CYSTECTOMY;  Surgeon: Benjaman Kindler, MD;  Location: [facility] ORS;  Service: Gynecology;  Laterality: Left;  . LAPAROSCOPY N/A 03/01/2016   Procedure: LAPAROSCOPY DIAGNOSTIC;   Surgeon: Benjaman Kindler, MD;  Location: [facility] ORS;  Service: Gynecology;  Laterality: N/A;  . WISDOM TOOTH EXTRACTION    . WISDOM TOOTH EXTRACTION      Past Obstetric History: # 1 - Date: None, Sex: None, Weight: None, GA: None, Delivery: None, Apgar1: None, Apgar5: None, Living: None, Birth Comments: None   Family History: Family History  Problem Relation Age of Onset  . Healthy Mother   . Healthy Father     Social History: Social History   Socioeconomic History  . Marital status: Married    Spouse name: Not on file  . Number of children: Not on file  . Years of education: Not on file  . Highest education level: Not on file  Occupational History  . Not on file  Tobacco Use  . Smoking status: Never Smoker  . Smokeless tobacco: Never Used  Substance and Sexual Activity  . Alcohol use: Yes    Alcohol/week: 2.0 standard drinks    Types: 1 Glasses of wine, 1 Cans of beer per week    Comment: occasionally  . Drug use: No  . Sexual activity: Yes    Birth control/protection: None  Other Topics Concern  . Not on file  Social History Narrative  . Not  on file   Social Determinants of Health   Financial Resource Strain:   . Difficulty of Paying Living Expenses: Not on file  Food Insecurity:   . Worried About Charity fundraiser in the Last Year: Not on file  . Ran Out of Food in the Last Year: Not on file  Transportation Needs:   . Lack of Transportation (Medical): Not on file  . Lack of Transportation (Non-Medical): Not on file  Physical Activity:   . Days of Exercise per Week: Not on file  . Minutes of Exercise per Session: Not on file  Stress:   . Feeling of Stress : Not on file  Social Connections:   . Frequency of Communication with Friends and Family: Not on file  . Frequency of Social Gatherings with Friends and Family: Not on file  . Attends Religious Services: Not on file  . Active Member of Clubs or Organizations: Not on file  . Attends Theatre manager Meetings: Not on file  . Marital Status: Not on file  Intimate Partner Violence:   . Fear of Current or Ex-Partner: Not on file  . Emotionally Abused: Not on file  . Physically Abused: Not on file  . Sexually Abused: Not on file    Medications: Prior to Admission medications   Medication Sig Start Date End Date Taking? Authorizing Provider  Prenatal Vit-Fe Fumarate-FA (MULTIVITAMIN-PRENATAL) 27-0.8 MG TABS tablet Take 1 tablet by mouth daily at 12 noon.   Yes [provider]  sertraline (ZOLOFT) 50 MG tablet Take 50 mg by mouth daily.   Yes [provider]  Doxylamine-Pyridoxine (DICLEGIS) 10-10 MG TBEC Take 2 tablets by mouth at bedtime. If symptoms persist, add one tablet in the morning and one in the afternoon Patient not taking: Reported on 07/05/2019 01/08/19   Gae Dry, MD    Allergies: No Known Allergies  Physical Exam: Vitals: Temperature 98.5 F (36.9 C), temperature source Oral, resp. rate 18, height 5\' 4"  (1.626 m), weight 73 kg, last menstrual period 11/23/2018. Body mass index is 27.64 kg/m.   FHT: 130, moderate, +accels, no decels Toco: q27min  General: NAD HEENT: normocephalic, anicteric Pulmonary: No increased work of breathing Cardiovascular: RRR, distal pulses 2+ Abdomen: Gravid, non-tender Leopolds: vtx Genitourinary: 1 to 5cm during course of rule out Extremities: no edema, erythema, or tenderness Neurologic: Grossly intact Psychiatric: mood appropriate, affect full  Labs: No results found for this or any previous visit (from the past 24 hour(s)).  Assessment: 29 y.o. G1P0000 [redacted]w[redacted]d by 08/30/2019, by Last Menstrual Period term labor  Plan: 1) Admit for term labor  2) Fetus - cat I tracing  3) PNL - Blood type O/Positive/-- (06/15 1534) / Anti-bodyscreen Positive, See Final Results (06/15 1534) / Rubella 4.23 (06/15 1534) / Varicella non-immune / RPR Non Reactive (11/09 1547) / HBsAg Negative (06/15 1534) / HIV  Non Reactive (11/09 1547) / 1-hr OGTT 128 / GBS Negative/-- (01/12 1200)  4) Immunization History -  Immunization History  Administered Date(s) Administered  . PPD Test 09/24/2014, 12/23/2015, 02/15/2016  . Tdap 10/13/2017    5) Disposition - pending delivery  Malachy Mood, MD, [place], [facility] 08/13/2019, 10:48 PM

## 2019-08-14 ENCOUNTER — Encounter: Payer: Self-pay | Admitting: Obstetrics and Gynecology

## 2019-08-14 ENCOUNTER — Inpatient Hospital Stay: Payer: Managed Care, Other (non HMO) | Admitting: Anesthesiology

## 2019-08-14 LAB — CBC
HCT: 30.3 % — ABNORMAL LOW (ref 36.0–46.0)
Hemoglobin: 10.4 g/dL — ABNORMAL LOW (ref 12.0–15.0)
MCH: 30.4 pg (ref 26.0–34.0)
MCHC: 34.3 g/dL (ref 30.0–36.0)
MCV: 88.6 fL (ref 80.0–100.0)
Platelets: 156 10*3/uL (ref 150–400)
RBC: 3.42 MIL/uL — ABNORMAL LOW (ref 3.87–5.11)
RDW: 13.7 % (ref 11.5–15.5)
WBC: 14.5 10*3/uL — ABNORMAL HIGH (ref 4.0–10.5)
nRBC: 0 % (ref 0.0–0.2)

## 2019-08-14 LAB — TYPE AND SCREEN
ABO/RH(D): O POS
Antibody Screen: NEGATIVE

## 2019-08-14 LAB — RPR: RPR Ser Ql: NONREACTIVE

## 2019-08-14 MED ORDER — DIPHENHYDRAMINE HCL 50 MG/ML IJ SOLN: 12.5000 mg | INTRAMUSCULAR | Status: DC | PRN

## 2019-08-14 MED ORDER — COCONUT OIL OIL
1.0000 "application " | TOPICAL_OIL | Status: DC | PRN
  Administered 2019-08-14: 1 via TOPICAL
  Filled 2019-08-14: qty 120

## 2019-08-14 MED ORDER — DIPHENHYDRAMINE HCL 25 MG PO CAPS: 25.0000 mg | ORAL_CAPSULE | Freq: Four times a day (QID) | ORAL | Status: DC | PRN

## 2019-08-14 MED ORDER — SODIUM CHLORIDE 0.9 % IV SOLN
INTRAVENOUS | Status: DC | PRN
  Administered 2019-08-14 (×2): 5 mL via EPIDURAL

## 2019-08-14 MED ORDER — ONDANSETRON HCL 4 MG PO TABS: 4.0000 mg | ORAL_TABLET | ORAL | Status: DC | PRN

## 2019-08-14 MED ORDER — BENZOCAINE-MENTHOL 20-0.5 % EX AERO: 1.0000 "application " | INHALATION_SPRAY | CUTANEOUS | Status: DC | PRN

## 2019-08-14 MED ORDER — ONDANSETRON HCL 4 MG/2ML IJ SOLN: 4.0000 mg | INTRAMUSCULAR | Status: DC | PRN

## 2019-08-14 MED ORDER — SIMETHICONE 80 MG PO CHEW: 80.0000 mg | CHEWABLE_TABLET | ORAL | Status: DC | PRN

## 2019-08-14 MED ORDER — PHENYLEPHRINE 40 MCG/ML (10ML) SYRINGE FOR IV PUSH (FOR BLOOD PRESSURE SUPPORT): 80.0000 ug | PREFILLED_SYRINGE | INTRAVENOUS | Status: DC | PRN

## 2019-08-14 MED ORDER — ACETAMINOPHEN 325 MG PO TABS
650.0000 mg | ORAL_TABLET | ORAL | Status: DC | PRN
  Administered 2019-08-14 (×3): 650 mg via ORAL
  Filled 2019-08-14 (×3): qty 2

## 2019-08-14 MED ORDER — EPHEDRINE 5 MG/ML INJ: 10.0000 mg | INTRAVENOUS | Status: DC | PRN

## 2019-08-14 MED ORDER — IBUPROFEN 600 MG PO TABS
600.0000 mg | ORAL_TABLET | Freq: Four times a day (QID) | ORAL | Status: DC
  Administered 2019-08-14: 17:00:00 600 mg via ORAL
  Filled 2019-08-14 (×2): qty 1

## 2019-08-14 MED ORDER — OXYCODONE-ACETAMINOPHEN 5-325 MG PO TABS: 1.0000 | ORAL_TABLET | ORAL | Status: DC | PRN

## 2019-08-14 MED ORDER — PRENATAL MULTIVITAMIN CH
1.0000 | ORAL_TABLET | Freq: Every day | ORAL | Status: DC
  Administered 2019-08-14 – 2019-08-15 (×2): 1 via ORAL
  Filled 2019-08-14 (×2): qty 1

## 2019-08-14 MED ORDER — FENTANYL 2.5 MCG/ML W/ROPIVACAINE 0.15% IN NS 100 ML EPIDURAL (ARMC)
EPIDURAL | Status: AC
  Filled 2019-08-14: qty 100

## 2019-08-14 MED ORDER — LIDOCAINE-EPINEPHRINE (PF) 1.5 %-1:200000 IJ SOLN
INTRAMUSCULAR | Status: DC | PRN
Start: 2019-08-14 — End: 2019-08-14
  Administered 2019-08-14: 3 mL via EPIDURAL

## 2019-08-14 MED ORDER — WITCH HAZEL-GLYCERIN EX PADS: 1.0000 "application " | MEDICATED_PAD | CUTANEOUS | Status: DC | PRN

## 2019-08-14 MED ORDER — SENNOSIDES-DOCUSATE SODIUM 8.6-50 MG PO TABS
2.0000 | ORAL_TABLET | ORAL | Status: DC
  Filled 2019-08-14: qty 2

## 2019-08-14 MED ORDER — OXYCODONE-ACETAMINOPHEN 5-325 MG PO TABS: 2.0000 | ORAL_TABLET | ORAL | Status: DC | PRN

## 2019-08-14 MED ORDER — DIBUCAINE (PERIANAL) 1 % EX OINT: 1.0000 "application " | TOPICAL_OINTMENT | CUTANEOUS | Status: DC | PRN

## 2019-08-14 MED ORDER — LACTATED RINGERS IV SOLN
500.0000 mL | Freq: Once | INTRAVENOUS | Status: AC
  Administered 2019-08-14: 500 mL via INTRAVENOUS

## 2019-08-14 MED ORDER — LIDOCAINE HCL (PF) 1 % IJ SOLN
INTRAMUSCULAR | Status: DC | PRN
Start: 2019-08-14 — End: 2019-08-14
  Administered 2019-08-14: 4 mL via SUBCUTANEOUS

## 2019-08-14 MED ORDER — FENTANYL 2.5 MCG/ML W/ROPIVACAINE 0.15% IN NS 100 ML EPIDURAL (ARMC)
12.0000 mL/h | EPIDURAL | Status: DC
  Administered 2019-08-14: 12 mL/h via EPIDURAL

## 2019-08-14 NOTE — Lactation Note (Signed)
This note was copied from a baby's chart. Lactation Consultation Note  Patient Name: Girl Aquilah Cundari [id number] Date: 08/14/2019 Reason for consult: Follow-up assessment   LC entered room for follow-up assessment. Mother and father of baby were both awake and present. Baby was skin-to-skin on mom, as the nurse reported that they are trying to get baby's temperature up. LC went over what to expect in terms of cluster feeding. LC reminded mother of baby to follow baby's cues and to offer the breast when baby is cuing. LC went over breast care and encouraged mother of baby to use the coconut oil to help with nipple tenderness. Mother of baby said that she was only feeling slight tenderness on her right breast, but that the coconut oil was helping. LC went over what to expect in terms of output, and how to tell when transitional and mature milk has come in. [facility] encouraged mother of baby to call out to lactation if she had any concerns and for continued lactation support.    Maternal Data    Feeding Feeding Type: Breast Fed  LATCH Score                   Interventions Interventions: Breast feeding basics reviewed;Coconut oil  Lactation Tools Discussed/Used     Consult Status Consult Status: Follow-up Date: 08/14/19 Follow-up type: In-patient    [name] 08/14/2019, 4:24 PM

## 2019-08-14 NOTE — Anesthesia Procedure Notes (Signed)
Epidural Patient location during procedure: OB  Staffing Anesthesiologist: Martha Clan, MD Performed: anesthesiologist   Preanesthetic Checklist Completed: patient identified, IV checked, site marked, risks and benefits discussed, surgical consent, monitors and equipment checked, pre-op evaluation and timeout performed  Epidural Patient position: sitting Prep: ChloraPrep Patient monitoring: heart rate, continuous pulse ox and blood pressure Approach: midline Location: L3-L4 Injection technique: LOR saline  Needle:  Needle type: Tuohy  Needle gauge: 17 G Needle length: 9 cm and 9 Needle insertion depth: 5 cm Catheter type: closed end flexible Catheter size: 19 Gauge Catheter at skin depth: 10 cm Test dose: negative and 1.5% lidocaine with Epi 1:200 K  Assessment Sensory level: T10 Events: blood not aspirated, injection not painful, no injection resistance, no paresthesia and negative IV test  Additional Notes 1st attempt Pt. Evaluated and documentation done after procedure finished. Patient identified. Risks/Benefits/Options discussed with patient including but not limited to bleeding, infection, nerve damage, paralysis, failed block, incomplete pain control, headache, blood pressure changes, nausea, vomiting, reactions to medication both or allergic, itching and postpartum back pain. Confirmed with bedside nurse the patient's most recent platelet count. Confirmed with patient that they are not currently taking any anticoagulation, have any bleeding history or any family history of bleeding disorders. Patient expressed understanding and wished to proceed. All questions were answered. Sterile technique was used throughout the entire procedure. Please see nursing notes for vital signs. Test dose was given through epidural catheter and negative prior to continuing to dose epidural or start infusion. Warning signs of high block given to the patient including shortness of breath,  tingling/numbness in hands, complete motor block, or any concerning symptoms with instructions to call for help. Patient was given instructions on fall risk and not to get out of bed. All questions and concerns addressed with instructions to call with any issues or inadequate analgesia.   Patient tolerated the insertion well without immediate complications.Reason for block:procedure for pain

## 2019-08-14 NOTE — Discharge Summary (Signed)
Obstetric Discharge Summary Reason for Admission: onset of labor Prenatal Procedures: none Intrapartum Procedures: spontaneous vaginal delivery Postpartum Procedures: Varivax Complications-Operative and Postpartum: none Hemoglobin  Date Value Ref Range Status  08/14/2019 10.4 (L) 12.0 - 15.0 g/dL Final  06/04/2019 10.0 (L) 11.1 - 15.9 g/dL Final   HCT  Date Value Ref Range Status  08/14/2019 30.3 (L) 36.0 - 46.0 % Final   Hematocrit  Date Value Ref Range Status  06/04/2019 29.5 (L) 34.0 - 46.6 % Final    Physical Exam:  Vital signs: BP 115/83 (BP Location: Right Arm)   Pulse 86   Temp 98.2 F (36.8 C) (Oral)   Resp 20   Ht 5\' 4"  (1.626 m)   Wt 73 kg   LMP 11/23/2018   SpO2 99%   Breastfeeding Unknown   BMI 27.64 kg/m  General: alert, cooperative and no distress Lochia: appropriate Uterine Fundus: firm/ U-1/ML/NT  DVT Evaluation: No evidence of DVT seen on physical exam.  Discharge Diagnoses: Term Pregnancy-delivered, Small for gestational age baby  Discharge Information: Date: 08/15/2019 Activity: pelvic rest, no heavy lifting x 6 weeks Diet: routine Allergies as of 08/15/2019   No Known Allergies     Medication List    STOP taking these medications   Doxylamine-Pyridoxine 10-10 MG Tbec Commonly known as: Diclegis     TAKE these medications   acetaminophen 325 MG tablet Commonly known as: Tylenol Take 2 tablets (650 mg total) by mouth every 4 (four) hours as needed for mild pain or moderate pain (for pain scale < 4).   ibuprofen 600 MG tablet Commonly known as: ADVIL Take 1 tablet (600 mg total) by mouth every 6 (six) hours as needed for moderate pain or cramping.   multivitamin-prenatal 27-0.8 MG Tabs tablet Take 1 tablet by mouth daily at 12 noon.   sertraline 50 MG tablet Commonly known as: ZOLOFT Take 50 mg by mouth daily.       Condition: stable Discharge to: home Follow-up Information    Malachy Mood, MD Follow up in 2 week(s).    Specialty: Obstetrics and Gynecology Why: postpartum mood check Contact information: [place] [address] [phone]           Newborn Data: Live born female Alma Friendly Birth Weight:  5#5oz APGAR: 72, 9  Newborn Delivery   Birth date/time: 08/14/2019 03:37:00 Delivery type: Vaginal, Spontaneous      Home with mother.  Dalia Heading 08/15/2019, 1:18 PM

## 2019-08-14 NOTE — Anesthesia Preprocedure Evaluation (Signed)
Anesthesia Evaluation  Patient identified by MRN, date of birth, ID band Patient awake    Reviewed: Allergy & Precautions, H&P , NPO status , Patient's Chart, lab work & pertinent test results, reviewed documented beta blocker date and time   History of Anesthesia Complications Negative for: history of anesthetic complications  Airway Mallampati: II  TM Distance: >3 FB Neck ROM: full    Dental no notable dental hx.    Pulmonary neg pulmonary ROS,    Pulmonary exam normal        Cardiovascular Exercise Tolerance: Good negative cardio ROS Normal cardiovascular exam     Neuro/Psych PSYCHIATRIC DISORDERS Anxiety negative neurological ROS     GI/Hepatic Neg liver ROS, GERD  ,  Endo/Other  negative endocrine ROS  Renal/GU negative Renal ROS  negative genitourinary   Musculoskeletal   Abdominal   Peds  Hematology negative hematology ROS (+)   Anesthesia Other Findings Past Medical History: No date: ADHD (attention deficit hyperactivity disorder) No date: Anxiety   Reproductive/Obstetrics (+) Pregnancy                             Anesthesia Physical Anesthesia Plan  ASA: II  Anesthesia Plan: Epidural   Post-op Pain Management:    Induction:   PONV Risk Score and Plan:   Airway Management Planned:   Additional Equipment:   Intra-op Plan:   Post-operative Plan:   Informed Consent: I have reviewed the patients History and Physical, chart, labs and discussed the procedure including the risks, benefits and alternatives for the proposed anesthesia with the patient or authorized representative who has indicated his/her understanding and acceptance.     Dental Advisory Given  Plan Discussed with: Anesthesiologist, CRNA and Surgeon  Anesthesia Plan Comments:         Anesthesia Quick Evaluation

## 2019-08-14 NOTE — Lactation Note (Signed)
This note was copied from a baby's chart. Lactation Consultation Note  Patient Name: Maria [name] [id number] Date: 08/14/2019 Reason for consult: Initial assessment;1st time breastfeeding;Early term 37-38.6wks   LC entered room for initial assessment. Mother of baby and father of baby were both present. Baby was in bassinet. Baby was starting to show mid-late hunger cues. LC went over what hunger cues to watch for. LC inquired about how initial breastfeeding has been going. Mother of baby reports the breastfeeding has been "surprisingly easy," and that her initial choice upon admission was breastmilk and formula, but now she would like to strictly breastfeed. [name] asked permission to watch a feed while in the room. Mom agreed and stated that the right side was a little easier for her than the left side. Mom reported that she had been using football hold on the right side. LC suggested using cross-cradle on the left side to initiate this feed, mother of baby was receptive. LC assisted with getting baby positioned onto the left breast. Baby sustained a good latch, and mother of baby did not report any discomfort. Baby nursed for 10 minutes on the left side. LC suggested mother of baby to switch the baby to the right side, after baby seemed finished with the left side. Mother of baby then switched baby to the right side into football hold. [name] assisted by giving support pillows. Baby's latch was good on the right breast as well. [facility] taught mother of baby hand expression after baby finished nursing, LC was able to easily express breastmilk from the right breast. Mother of baby was also able to express easily. [facility] taught mother of baby about transitional milk and what output to expect from baby in the coming days. [name] inquired about whether or not mom has a pump at home, mom reports that she plans to get one from the insurance company. LC encouraged mom to feed baby on demand and to hand express as needed to relieve  breast fullness if necessary. LC encouraged mom to call out as needed for assistance.    Maternal Data Formula Feeding for Exclusion: No Has patient been taught Hand Expression?: Yes Does the patient have breastfeeding experience prior to this delivery?: No  Feeding Feeding Type: Breast Fed  LATCH Score Latch: Grasps breast easily, tongue down, lips flanged, rhythmical sucking.  Audible Swallowing: Spontaneous and intermittent(Audible swallows on the R breast)  Type of Nipple: Everted at rest and after stimulation  Comfort (Breast/Nipple): Soft / non-tender  Hold (Positioning): Assistance needed to correctly position infant at breast and maintain latch.  LATCH Score: 9  Interventions Interventions: Breast feeding basics reviewed;Assisted with latch;Hand express;Support pillows;Adjust position;Position options;Expressed milk  Lactation Tools Discussed/Used     Consult Status Consult Status: Follow-up Date: 08/14/19 Follow-up type: In-patient    Maria [name] 08/14/2019, 9:43 AM

## 2019-08-14 NOTE — Progress Notes (Signed)
   Subjective:  Comfortable with epidural in place Objective:   Vitals: Blood pressure 136/82, pulse 98, temperature 98.4 F (36.9 C), temperature source Oral, resp. rate 16, height 5\' 4"  (1.626 m), weight 73 kg, last menstrual period 11/23/2018, SpO2 99 %. General: NAD Abdomen: gravid, non-tender Cervical Exam:  Dilation: Lip/rim Effacement (%): Thick Cervical Position: Posterior Station: Plus 3 Presentation: Vertex Exam by:: RS  FHT: 125, moderate, +accels, no decels Toco: q7min  Results for orders placed or performed during the hospital encounter of 08/13/19 (from the past 24 hour(s))  Respiratory Panel by RT PCR (Flu A&B, Covid) - Nasopharyngeal Swab     Status: None   Collection Time: 08/13/19 11:00 PM   Specimen: Nasopharyngeal Swab  Result Value Ref Range   SARS Coronavirus 2 by RT PCR NEGATIVE NEGATIVE   Influenza A by PCR NEGATIVE NEGATIVE   Influenza B by PCR NEGATIVE NEGATIVE  CBC     Status: Abnormal   Collection Time: 08/13/19 11:19 PM  Result Value Ref Range   WBC 16.2 (H) 4.0 - 10.5 K/uL   RBC 3.93 3.87 - 5.11 MIL/uL   Hemoglobin 12.1 12.0 - 15.0 g/dL   HCT 34.2 (L) 36.0 - 46.0 %   MCV 87.0 80.0 - 100.0 fL   MCH 30.8 26.0 - 34.0 pg   MCHC 35.4 30.0 - 36.0 g/dL   RDW 13.4 11.5 - 15.5 %   Platelets 184 150 - 400 K/uL   nRBC 0.0 0.0 - 0.2 %  Type and screen [place]     Status: None   Collection Time: 08/13/19 11:19 PM  Result Value Ref Range   ABO/RH(D) O POS    Antibody Screen NEG    Sample Expiration      08/16/2019,2359 Performed at [facility], [address] [place], [place] [address]     Assessment:   29 y.o. G1P0000 [redacted]w[redacted]d term labor   Plan:   1) Labor -recheck in 1-hr   2) Fetus - cat I tracing  Malachy Mood, MD, [place] 08/14/2019, 1:52 AM

## 2019-08-15 ENCOUNTER — Encounter: Payer: Self-pay | Admitting: Obstetrics and Gynecology

## 2019-08-15 ENCOUNTER — Encounter: Payer: Managed Care, Other (non HMO) | Admitting: Advanced Practice Midwife

## 2019-08-15 MED ORDER — VARICELLA VIRUS VACCINE LIVE 1350 PFU/0.5ML IJ SUSR
0.5000 mL | Freq: Once | INTRAMUSCULAR | Status: AC
  Administered 2019-08-15: 0.5 mL via SUBCUTANEOUS
  Filled 2019-08-15 (×2): qty 0.5

## 2019-08-15 MED ORDER — ACETAMINOPHEN 325 MG PO TABS: 650.0000 mg | ORAL_TABLET | ORAL | 1 refills | Status: DC | PRN

## 2019-08-15 MED ORDER — SERTRALINE HCL 25 MG PO TABS
50.0000 mg | ORAL_TABLET | Freq: Every day | ORAL | Status: DC
  Administered 2019-08-15: 50 mg via ORAL
  Filled 2019-08-15: qty 2

## 2019-08-15 MED ORDER — IBUPROFEN 600 MG PO TABS: 600.0000 mg | ORAL_TABLET | Freq: Four times a day (QID) | ORAL | 0 refills | Status: DC | PRN

## 2019-08-15 NOTE — Lactation Note (Signed)
This note was copied from a baby's chart. Lactation Consultation Note  Patient Name: Maria [name] [id number] Date: 08/15/2019 Reason for consult: Follow-up assessment;1st time breastfeeding;Early term 37-38.6wks  LC called in to assist mom with latching/feeding this morning. Mom was up in chair attempting to latch on left breast in cradle position. Baby showing to be relaxed and asleep in mom's lap, non-cuing, despite mom's efforts to feed. LC provided education and guidance based on baby's current behavior; relaxed hands and body language, and non-cuing indicate baby is not hungry. LC provided reassurance based on baby's feeding pattern overnight to have mirrored a cluster feeding period followed by a sleepy period.  Baby will go for car seat test shortly; [place] encouraged parents to rest, and how to determine if baby is hungry once she returns. Parents excited to go home later today; [place] will follow-up before discharge.  Maternal Data Formula Feeding for Exclusion: No Has patient been taught Hand Expression?: Yes Does the patient have breastfeeding experience prior to this delivery?: No  Feeding Feeding Type: Breast Fed  LATCH Score                   Interventions Interventions: Breast feeding basics reviewed  Lactation Tools Discussed/Used     Consult Status Consult Status: Complete Date: 08/15/19 Follow-up type: In-patient    [name] 08/15/2019, 10:54 AM

## 2019-08-15 NOTE — Discharge Instructions (Signed)
°Vaginal Delivery, Care After °Refer to this sheet in the next few weeks. These discharge instructions provide you with information on caring for yourself after delivery. Your caregiver may also give you specific instructions. Your treatment has been planned according to the most current medical practices available, but problems sometimes occur. Call your caregiver if you have any problems or questions after you go home. °HOME CARE INSTRUCTIONS °1. Take over-the-counter or prescription medicines only as directed by your caregiver or pharmacist. °2. Do not drink alcohol, especially if you are breastfeeding or taking medicine to relieve pain. °3. Do not smoke tobacco. °4. Continue to use good perineal care. Good perineal care includes: °1. Wiping your perineum from back to front °2. Keeping your perineum clean. °3. You can do sitz baths twice a day, to help keep this area clean °5. Do not use tampons, douche or have sex for 6 weeks °6. Shower only and avoid sitting in submerged water, aside from sitz baths °7. Wear a well-fitting bra that provides breast support. °8. Eat healthy foods. °9. Drink enough fluids to keep your urine clear or pale yellow. °10. Eat high-fiber foods such as whole grain cereals and breads, brown rice, beans, and fresh fruits and vegetables every day. These foods may help prevent or relieve constipation. °11. Avoid constipation with high fiber foods or medications, such as miralax or metamucil °12. Follow your caregiver's recommendations regarding resumption of activities such as climbing stairs, driving, lifting, exercising, or traveling. °13. Talk to your caregiver about resuming sexual activities. Resumption of sexual activities after 6 weeks is dependent upon your risk of infection, your rate of healing, and your comfort and desire to resume sexual activity. °14. Try to have someone help you with your household activities and your newborn for at least a few days after you leave the  hospital. °15. Rest as much as possible. Try to rest or take a nap when your newborn is sleeping. °16. Increase your activities gradually. °17. Keep all of your scheduled postpartum appointments. It is very important to keep your scheduled follow-up appointments. At these appointments, your caregiver will be checking to make sure that you are healing physically and emotionally. °SEEK MEDICAL CARE IF:  °· You are passing large clots from your vagina. Save any clots to show your caregiver. °· You have a foul smelling discharge from your vagina. °· You have trouble urinating. °· You are urinating frequently. °· You have pain when you urinate. °· You have a change in your bowel movements. °· You have increasing redness, pain, or swelling near your vaginal incision (episiotomy) or vaginal tear. °· You have pus draining from your episiotomy or vaginal tear. °· Your episiotomy or vaginal tear is separating. °· You have painful, hard, or reddened breasts. °· You have a severe headache. °· You have blurred vision or see spots. °· You feel sad or depressed. °· You have thoughts of hurting yourself or your newborn. °· You have questions about your care, the care of your newborn, or medicines. °· You are dizzy or light-headed. °· You have a rash. °· You have nausea or vomiting. °· You were breastfeeding and have not had a menstrual period within 12 weeks after you stopped breastfeeding. °· You are not breastfeeding and have not had a menstrual period by the 12th week after delivery. °· You have a fever of 100.5 or more °SEEK IMMEDIATE MEDICAL CARE IF:  °· You have persistent pain. °· You have chest pain. °· You have shortness   of breath. °· You faint. °· You have leg pain. °· You have stomach pain. °· Your vaginal bleeding saturates two or more sanitary pads in 1 hour. °MAKE SURE YOU:  °· Understand these instructions. °· Will watch your condition. °· Will get help right away if you are not doing well or get worse. °Document  Released: 07/09/2000 Document Revised: 11/26/2013 Document Reviewed: 03/08/2012 °ExitCare® Patient Information ©2015 [employer]. This information is not intended to replace advice given to you by your health care provider. Make sure you discuss any questions you have with your health care provider. ° °Sitz Bath °A sitz bath is a warm water bath taken in the sitting position. The water covers only the hips and butt (buttocks). We recommend using one that fits in the toilet, to help with ease of use and cleanliness. It may be used for either healing or cleaning purposes. Sitz baths are also used to relieve pain, itching, or muscle tightening (spasms). The water may contain medicine. Moist heat will help you heal and relax.  °HOME CARE  °Take 3 to 4 sitz baths a day. °18. Fill the bathtub half-full with warm water. °19. Sit in the water and open the drain a little. °20. Turn on the warm water to keep the tub half-full. Keep the water running constantly. °21. Soak in the water for 15 to 20 minutes. °22. After the sitz bath, pat the affected area dry. °GET HELP RIGHT AWAY IF: °You get worse instead of better. Stop the sitz baths if you get worse. °MAKE SURE YOU: °· Understand these instructions. °· Will watch your condition. °· Will get help right away if you are not doing well or get worse. °Document Released: 08/19/2004 Document Revised: 04/05/2012 Document Reviewed: 11/09/2010 °ExitCare® Patient Information ©2015 [employer]. This information is not intended to replace advice given to you by your health care provider. Make sure you discuss any questions you have with your health care provider. ° ° °

## 2019-08-15 NOTE — Anesthesia Postprocedure Evaluation (Signed)
Anesthesia Post Note  Patient: Maria [name]  Procedure(s) Performed: AN AD [place]  Patient location during evaluation: Mother Baby Anesthesia Type: Epidural Level of consciousness: awake and alert Pain management: pain level controlled Vital Signs Assessment: post-procedure vital signs reviewed and stable Respiratory status: spontaneous breathing, nonlabored ventilation and respiratory function stable Cardiovascular status: stable Postop Assessment: no headache, no backache and epidural receding Anesthetic complications: no     Last Vitals:  Vitals:   08/14/19 2021 08/14/19 2335  BP: 127/90 (!) 133/91  Pulse: 73 93  Resp: 20 18  Temp: 36.9 C 36.9 C  SpO2: 98% 98%    Last Pain:  Vitals:   08/14/19 2335  TempSrc: Oral  PainSc:                  [name]

## 2019-08-15 NOTE — Progress Notes (Signed)
Patient discharged home with infant. Discharge instructions, prescriptions and follow up appointment given to and reviewed with patient. Patient verbalized understanding. Wheeled out with infant by [employer]

## 2019-08-15 NOTE — Progress Notes (Signed)
Post Partum Day 1 Subjective: up ad lib, voiding and tolerating PO. Some cramping-taking Motrin and Tylenol with relief. Breast feeding. BAby passed car seat test. Mother would like to go home today.   Objective: Blood pressure 115/83, pulse 86, temperature 98.2 F (36.8 C), temperature source Oral, resp. rate 20, height 5\' 4"  (1.626 m), weight 73 kg, last menstrual period 11/23/2018, SpO2 99 %, unknown if currently breastfeeding.  Physical Exam:  General: alert, cooperative and no distress Lochia: appropriate Uterine Fundus: firm/ U-1/ML/NT  DVT Evaluation: No evidence of DVT seen on physical exam.  Recent Labs    08/13/19 2319 08/14/19 0850  HGB 12.1 10.4*  HCT 34.2* 30.3*  WBC 16.2* 14.5*  PLT 184 156    Assessment/Plan: PPD #1-stable Discharge  Home today with instructions Resumed Zoloft 50 mgm daily O POS/ RI/ VNI TDAP and flu vaccine UTD Breast Mirena IUD   LOS: 2 days   Dalia Heading 08/15/2019, 1:08 PM

## 2019-08-30 ENCOUNTER — Inpatient Hospital Stay: Admit: 2019-08-30 | Payer: Self-pay

## 2019-08-31 ENCOUNTER — Telehealth: Payer: Self-pay | Admitting: Obstetrics and Gynecology

## 2019-08-31 ENCOUNTER — Encounter: Payer: Self-pay | Admitting: Obstetrics and Gynecology

## 2019-08-31 ENCOUNTER — Other Ambulatory Visit: Payer: Self-pay

## 2019-08-31 ENCOUNTER — Ambulatory Visit (INDEPENDENT_AMBULATORY_CARE_PROVIDER_SITE_OTHER): Payer: Managed Care, Other (non HMO) | Admitting: Obstetrics and Gynecology

## 2019-08-31 DIAGNOSIS — Z1332 Encounter for screening for maternal depression: Secondary | ICD-10-CM

## 2019-08-31 NOTE — Telephone Encounter (Signed)
3/5 at 1010 with AMS for mirena

## 2019-08-31 NOTE — Progress Notes (Signed)
Postoperative Follow-up Patient presents 2 week postpartum for depression check  Subjective: Patient reports she is doing well with restarting Zoloft 50mg .   No signs or symptoms of postpartum depression, no side-effects  Objective: Blood pressure 104/68, pulse 98, height 5\' 4"  (1.626 m), weight 147 lb (66.7 kg), currently breastfeeding.  General: NAD Pulmonary: no increased work of breathing Psychologic:  Affect full, mood appropriate Neurologic: normal gait    Admission on 08/13/2019, Discharged on 08/15/2019  Component Date Value Ref Range Status  . SARS Coronavirus 2 by RT PCR 08/13/2019 NEGATIVE  NEGATIVE Final   Comment: (NOTE) SARS-CoV-2 target nucleic acids are NOT DETECTED. The SARS-CoV-2 RNA is generally detectable in upper respiratoy specimens during the acute phase of infection. The lowest concentration of SARS-CoV-2 viral copies this assay can detect is 131 copies/mL. A negative result does not preclude SARS-Cov-2 infection and should not be used as the sole basis for treatment or other patient management decisions. A negative result may occur with  improper specimen collection/handling, submission of specimen other than nasopharyngeal swab, presence of viral mutation(s) within the areas targeted by this assay, and inadequate number of viral copies (<131 copies/mL). A negative result must be combined with clinical observations, patient history, and epidemiological information. The expected result is Negative. Fact Sheet for Patients:  PinkCheek.be Fact Sheet for Healthcare Providers:  GravelBags.it This test is not yet ap                          proved or cleared by the [place] FDA and  has been authorized for detection and/or diagnosis of SARS-CoV-2 by FDA under an Emergency Use Authorization (EUA). This EUA will remain  in effect (meaning this test can be used) for the duration of  the COVID-19 declaration under Section 564(b)(1) of the Act, 21 U.S.C. section 360bbb-3(b)(1), unless the authorization is terminated or revoked sooner.   . Influenza A by PCR 08/13/2019 NEGATIVE  NEGATIVE Final  . Influenza B by PCR 08/13/2019 NEGATIVE  NEGATIVE Final   Comment: (NOTE) The Xpert Xpress SARS-CoV-2/FLU/RSV assay is intended as an aid in  the diagnosis of influenza from Nasopharyngeal swab specimens and  should not be used as a sole basis for treatment. Nasal washings and  aspirates are unacceptable for Xpert Xpress SARS-CoV-2/FLU/RSV  testing. Fact Sheet for Patients: PinkCheek.be Fact Sheet for Healthcare Providers: GravelBags.it This test is not yet approved or cleared by the [place] FDA and  has been authorized for detection and/or diagnosis of SARS-CoV-2 by  FDA under an Emergency Use Authorization (EUA). This EUA will remain  in effect (meaning this test can be used) for the duration of the  Covid-19 declaration under Section 564(b)(1) of the Act, 21  U.S.C. section 360bbb-3(b)(1), unless the authorization is  terminated or revoked. Performed at [facility], [address] [place], [place] [address]   . WBC 08/13/2019 16.2* 4.0 - 10.5 K/uL Final  . RBC 08/13/2019 3.93  3.87 - 5.11 MIL/uL Final  . Hemoglobin 08/13/2019 12.1  12.0 - 15.0 g/dL Final  . HCT 08/13/2019 34.2* 36.0 - 46.0 % Final  . MCV 08/13/2019 87.0  80.0 - 100.0 fL Final  . MCH 08/13/2019 30.8  26.0 - 34.0 pg Final  . MCHC 08/13/2019 35.4  30.0 - 36.0 g/dL Final  . RDW 08/13/2019 13.4  11.5 - 15.5 % Final  . Platelets 08/13/2019 184  150 - 400 K/uL Final  .  nRBC 08/13/2019 0.0  0.0 - 0.2 % Final   Performed at [facility], [address] [place], [place] [address]  . ABO/RH(D) 08/13/2019 O POS   Final  . Antibody Screen 08/13/2019 NEG   Final  . Sample Expiration 08/13/2019    Final                    Value:08/16/2019,2359 Performed at [facility], [address] [place], [place] [address]   . RPR Ser Ql 08/13/2019 NON REACTIVE  NON REACTIVE Final   Performed at [facility], [place] [address], [place], [place] [address]  . WBC 08/14/2019 14.5* 4.0 - 10.5 K/uL Final  . RBC 08/14/2019 3.42* 3.87 - 5.11 MIL/uL Final  . Hemoglobin 08/14/2019 10.4* 12.0 - 15.0 g/dL Final  . HCT 08/14/2019 30.3* 36.0 - 46.0 % Final  . MCV 08/14/2019 88.6  80.0 - 100.0 fL Final  . MCH 08/14/2019 30.4  26.0 - 34.0 pg Final  . MCHC 08/14/2019 34.3  30.0 - 36.0 g/dL Final  . RDW 08/14/2019 13.7  11.5 - 15.5 % Final  . Platelets 08/14/2019 156  150 - 400 K/uL Final  . nRBC 08/14/2019 0.0  0.0 - 0.2 % Final   Performed at [facility], [address] [place], [place] [address]   Flavia Shipper Postnatal Depression Scale - 08/31/19 1010      Edinburgh Postnatal Depression Scale:  In the Past 7 Days   I have been able to laugh and see the funny side of things.  0    I have looked forward with enjoyment to things.  0    I have blamed myself unnecessarily when things went wrong.  1    I have been anxious or worried for no good reason.  2    I have felt scared or panicky for no good reason.  2    Things have been getting on top of me.  0    I have been so unhappy that I have had difficulty sleeping.  1    I have felt sad or miserable.  1    I have been so unhappy that I have been crying.  0    The thought of harming myself has occurred to me.  0    Edinburgh Postnatal Depression Scale Total  7       Assessment: 29 y.o. s/p TSVD stable  Plan: Patient has done well after delivery with no apparent complications.  I have discussed the post-operative course to date, and the expected progress moving forward.  The patient understands what complications to be concerned about.  I will see the patient in routine follow up, or sooner if needed.    Activity plan: pelvic rest  No concerns for  postpartum depression currently.  EDPS of 7.  Will re-evalute at 6 week postpartum  Return in about 4 weeks (around 09/28/2019) for 6 week postpartum and Mirena IUD insertion.    Malachy Mood, MD, [facility], [facility] 08/31/2019, 10:01 AM

## 2019-09-03 NOTE — Telephone Encounter (Signed)
Noted. Will order to arrive by apt date/time. 

## 2019-09-22 ENCOUNTER — Ambulatory Visit: Payer: Managed Care, Other (non HMO) | Attending: Internal Medicine

## 2019-09-22 DIAGNOSIS — Z23 Encounter for immunization: Secondary | ICD-10-CM | POA: Insufficient documentation

## 2019-09-22 NOTE — Progress Notes (Signed)
   Covid-19 Vaccination Clinic  Name:  [name]    MRN: [id number] DOB: 1990/10/07  09/22/2019  [name] was observed post Covid-19 immunization for 15 minutes without incidence. She was provided with Vaccine Information Sheet and instruction to access the V-Safe system.   [name] was instructed to call 911 with any severe reactions post vaccine: Marland Kitchen Difficulty breathing  . Swelling of your face and throat  . A fast heartbeat  . A bad rash all over your body  . Dizziness and weakness    Immunizations Administered    Name Date Dose VIS Date Route   Moderna COVID-19 Vaccine 09/22/2019 10:01 AM 0.5 mL 06/26/2019 Intramuscular   Manufacturer: Moderna   Lot: [id number]   [id number]:[id number]

## 2019-09-28 ENCOUNTER — Other Ambulatory Visit: Payer: Self-pay

## 2019-09-28 ENCOUNTER — Encounter: Payer: Self-pay | Admitting: Obstetrics and Gynecology

## 2019-09-28 ENCOUNTER — Ambulatory Visit (INDEPENDENT_AMBULATORY_CARE_PROVIDER_SITE_OTHER): Payer: Managed Care, Other (non HMO) | Admitting: Obstetrics and Gynecology

## 2019-09-28 DIAGNOSIS — Z30430 Encounter for insertion of intrauterine contraceptive device: Secondary | ICD-10-CM

## 2019-09-28 DIAGNOSIS — Z1332 Encounter for screening for maternal depression: Secondary | ICD-10-CM

## 2019-09-28 HISTORY — PX: OTHER SURGICAL HISTORY: SHX169

## 2019-09-28 NOTE — Progress Notes (Signed)
Postpartum Visit  Chief Complaint:  Chief Complaint  Patient presents with  . Postpartum Care    vaginal delivery 1/19  . IUD insertion    History of Present Illness: Patient is a 29 y.o. G1P1001 presents for postpartum visit.  Date of delivery: 08/14/2019 Type of delivery: Vaginal delivery - Vacuum or forceps assisted  no Episiotomy No.  Laceration: yes 2nd degree Pregnancy or labor problems:  no Any problems since the delivery:  no  Newborn Details:  SINGLETON :  1. BabyGender female. Birth weight: 5lbs 5oz Maternal Details:  Breast or formula feeding: plans to breastfeed Contraception after delivery: No  Any bowel or bladder issues: No  Post partum depression/anxiety noted:  no Edinburgh Post-Partum Depression Score:2 Date of last PAP: 08/17/2018 no abnormalities   Review of Systems: ROS  The following portions of the patient's history were reviewed and updated as appropriate: allergies, current medications, past family history, past medical history, past social history, past surgical history and problem list.  Past Medical History:  Past Medical History:  Diagnosis Date  . ADHD (attention deficit hyperactivity disorder)   . Anxiety     Past Surgical History:  Past Surgical History:  Procedure Laterality Date  . LAPAROSCOPIC OVARIAN CYSTECTOMY Left 03/01/2016   Procedure: LAPAROSCOPIC OVARIAN CYSTECTOMY;  Surgeon: Benjaman Kindler, MD;  Location: [facility] ORS;  Service: Gynecology;  Laterality: Left;  . LAPAROSCOPY N/A 03/01/2016   Procedure: LAPAROSCOPY DIAGNOSTIC;  Surgeon: Benjaman Kindler, MD;  Location: [facility] ORS;  Service: Gynecology;  Laterality: N/A;  . Mirena insertion  09/28/2019  . WISDOM TOOTH EXTRACTION    . WISDOM TOOTH EXTRACTION      Family History:  Family History  Problem Relation Age of Onset  . Healthy Mother   . Healthy Father     Social History:  Social History   Socioeconomic History  . Marital status: Married    Spouse name: Not on  file  . Number of children: Not on file  . Years of education: Not on file  . Highest education level: Not on file  Occupational History  . Not on file  Tobacco Use  . Smoking status: Never Smoker  . Smokeless tobacco: Never Used  Substance and Sexual Activity  . Alcohol use: Yes    Alcohol/week: 2.0 standard drinks    Types: 1 Glasses of wine, 1 Cans of beer per week    Comment: occasionally  . Drug use: No  . Sexual activity: Yes    Birth control/protection: None  Other Topics Concern  . Not on file  Social History Narrative  . Not on file   Social Determinants of Health   Financial Resource Strain:   . Difficulty of Paying Living Expenses: Not on file  Food Insecurity:   . Worried About Charity fundraiser in the Last Year: Not on file  . Ran Out of Food in the Last Year: Not on file  Transportation Needs:   . Lack of Transportation (Medical): Not on file  . Lack of Transportation (Non-Medical): Not on file  Physical Activity:   . Days of Exercise per Week: Not on file  . Minutes of Exercise per Session: Not on file  Stress:   . Feeling of Stress : Not on file  Social Connections:   . Frequency of Communication with Friends and Family: Not on file  . Frequency of Social Gatherings with Friends and Family: Not on file  . Attends Religious Services: Not on file  .  Active Member of Clubs or Organizations: Not on file  . Attends Archivist Meetings: Not on file  . Marital Status: Not on file  Intimate Partner Violence:   . Fear of Current or Ex-Partner: Not on file  . Emotionally Abused: Not on file  . Physically Abused: Not on file  . Sexually Abused: Not on file    Allergies:  No Known Allergies  Medications: Prior to Admission medications   Medication Sig Start Date End Date Taking? Authorizing Provider  acetaminophen (TYLENOL) 325 MG tablet Take 2 tablets (650 mg total) by mouth every 4 (four) hours as needed for mild pain or moderate pain (for  pain scale < 4). 08/15/19  Yes Dalia Heading, CNM  ibuprofen (ADVIL) 600 MG tablet Take 1 tablet (600 mg total) by mouth every 6 (six) hours as needed for moderate pain or cramping. 08/15/19  Yes Dalia Heading, CNM  Prenatal Vit-Fe Fumarate-FA (MULTIVITAMIN-PRENATAL) 27-0.8 MG TABS tablet Take 1 tablet by mouth daily at 12 noon.   Yes [provider]  sertraline (ZOLOFT) 50 MG tablet Take 50 mg by mouth daily.   Yes [provider]    Physical Exam Blood pressure 112/66, pulse 89, weight 148 lb (67.1 kg), currently breastfeeding.    General: NAD HEENT: normocephalic, anicteric Pulmonary: No increased work of breathing Abdomen: NABS, soft, non-tender, non-distended.  Umbilicus without lesions.  No hepatomegaly, splenomegaly or masses palpable. No evidence of hernia. Genitourinary:  External: Normal external female genitalia.  Normal urethral meatus, normal  Bartholin's and Skene's glands.    Vagina: Normal vaginal mucosa, no evidence of prolapse.    Cervix: Grossly normal in appearance, no bleeding  Uterus: Non-enlarged, mobile, normal contour.  No CMT  Adnexa: ovaries non-enlarged, no adnexal masses  Rectal: deferred Extremities: no edema, erythema, or tenderness Neurologic: Grossly intact Psychiatric: mood appropriate, affect full  Edinburgh Postnatal Depression Scale - 09/28/19 0959      Edinburgh Postnatal Depression Scale:  In the Past 7 Days   I have been able to laugh and see the funny side of things.  0    I have looked forward with enjoyment to things.  0    I have blamed myself unnecessarily when things went wrong.  0    I have been anxious or worried for no good reason.  0    I have felt scared or panicky for no good reason.  0    Things have been getting on top of me.  1    I have been so unhappy that I have had difficulty sleeping.  1    I have felt sad or miserable.  0    I have been so unhappy that I have been crying.  0    The thought of  harming myself has occurred to me.  0    Flavia Shipper Postnatal Depression Scale Total  2        GYNECOLOGY OFFICE PROCEDURE NOTE  Zayleigh Neder is a 29 y.o. G1P1001 here for a Mirena IUD insertion. No GYN concerns.  Last pap smear was on 08/17/2018  and was normal.  The patient is currently using exclusive breast feeding and 6 weeks postpartum  for contraception and her LMP is No LMP recorded..  The indication for her IUD is contraception/cycle control.  IUD Insertion Procedure Note Patient identified, informed consent performed, consent signed.   Discussed risks of irregular bleeding, cramping, infection, malpositioning, expulsion or uterine perforation of the IUD (1:1000 placements)  which may require further procedure such as laparoscopy.  IUD while effective at preventing pregnancy do not prevent transmission of sexually transmitted diseases and use of barrier methods for this purpose was discussed. Time out was performed.  Urine pregnancy test negative.  Speculum placed in the vagina.  Cervix visualized.  Cleaned with Betadine x 2.  Grasped anteriorly with a single tooth tenaculum.  Uterus sounded to 8 cm. IUD placed per manufacturer's recommendations.  Strings trimmed to 3 cm. Tenaculum was removed, good hemostasis noted.  Patient tolerated procedure well.   Patient was given post-procedure instructions.  She was advised to have backup contraception for one week.  Patient was also asked to check IUD strings periodically and follow up in 6 weeks for IUD check.    Assessment: 29 y.o. G1P1001 presenting for 6 week postpartum visit  Plan: Problem List Items Addressed This Visit    None    Visit Diagnoses    6 weeks postpartum follow-up    -  Primary   Encounter for IUD insertion           1) Contraception - Education given regarding options for contraception, as well as compatibility with breast feeding if applicable.  Patient plans on IUD for contraception.  2)  Pap - ASCCP  guidelines and rational discussed.  ASCCP guidelines and rational discussed.  Patient opts for every 3 years screening interval  3) Patient underwent screening for postpartum depression with no signs of depression   4) Return in about 6 weeks (around 11/09/2019) for IUD string check.   Malachy Mood, MD, [facility], [facility] 09/28/2019, 10:16 AM

## 2019-10-20 ENCOUNTER — Ambulatory Visit: Payer: Managed Care, Other (non HMO) | Attending: Internal Medicine

## 2019-10-20 DIAGNOSIS — Z23 Encounter for immunization: Secondary | ICD-10-CM

## 2019-10-20 NOTE — Progress Notes (Signed)
   Covid-19 Vaccination Clinic  Name:  Joumana Beichner    MRN: [id number] DOB: 1991/05/20  10/20/2019  Ms. Crail was observed post Covid-19 immunization for 15 minutes without incident. She was provided with Vaccine Information Sheet and instruction to access the V-Safe system.   Ms. Terlecki was instructed to call 911 with any severe reactions post vaccine: Marland Kitchen Difficulty breathing  . Swelling of face and throat  . A fast heartbeat  . A bad rash all over body  . Dizziness and weakness   Immunizations Administered    Name Date Dose VIS Date Route   Moderna COVID-19 Vaccine 10/20/2019  1:50 PM 0.5 mL 06/26/2019 Intramuscular   Manufacturer: Levan Hurst   LotUD:[id number]   [place]:[id number]

## 2019-10-23 ENCOUNTER — Ambulatory Visit: Payer: Managed Care, Other (non HMO)

## 2019-11-16 ENCOUNTER — Other Ambulatory Visit: Payer: Self-pay

## 2019-11-16 ENCOUNTER — Ambulatory Visit (INDEPENDENT_AMBULATORY_CARE_PROVIDER_SITE_OTHER): Payer: Managed Care, Other (non HMO) | Admitting: Obstetrics and Gynecology

## 2019-11-16 ENCOUNTER — Encounter: Payer: Self-pay | Admitting: Obstetrics and Gynecology

## 2019-11-16 DIAGNOSIS — Z975 Presence of (intrauterine) contraceptive device: Secondary | ICD-10-CM | POA: Diagnosis not present

## 2019-11-22 NOTE — Progress Notes (Signed)
Obstetrics & Gynecology Office Visit   Chief Complaint:  Chief Complaint  Patient presents with  . Follow-up    IUD string check    History of Present Illness: 29 y.o. patient presenting for follow up of Mirena IUD placement 6+ weeks ago.  The indication for her IUD was contraception.  She denies any complications since her IUD placement.  Still having some occasional spotting.  is able to feel strings.    Review of Systems: Review of Systems  Constitutional: Negative.   Gastrointestinal: Negative.   Genitourinary: Negative.     Past Medical History:  Past Medical History:  Diagnosis Date  . ADHD (attention deficit hyperactivity disorder)   . Anxiety     Past Surgical History:  Past Surgical History:  Procedure Laterality Date  . LAPAROSCOPIC OVARIAN CYSTECTOMY Left 03/01/2016   Procedure: LAPAROSCOPIC OVARIAN CYSTECTOMY;  Surgeon: Benjaman Kindler, MD;  Location: [facility] ORS;  Service: Gynecology;  Laterality: Left;  . LAPAROSCOPY N/A 03/01/2016   Procedure: LAPAROSCOPY DIAGNOSTIC;  Surgeon: Benjaman Kindler, MD;  Location: [facility] ORS;  Service: Gynecology;  Laterality: N/A;  . Mirena insertion  09/28/2019  . WISDOM TOOTH EXTRACTION    . WISDOM TOOTH EXTRACTION      Gynecologic History: No LMP recorded. (Menstrual status: IUD).  Obstetric History: G1P1001  Family History:  Family History  Problem Relation Age of Onset  . Healthy Mother   . Healthy Father     Social History:  Social History   Socioeconomic History  . Marital status: Married    Spouse name: Not on file  . Number of children: Not on file  . Years of education: Not on file  . Highest education level: Not on file  Occupational History  . Not on file  Tobacco Use  . Smoking status: Never Smoker  . Smokeless tobacco: Never Used  Substance and Sexual Activity  . Alcohol use: Yes    Alcohol/week: 2.0 standard drinks    Types: 1 Glasses of wine, 1 Cans of beer per week    Comment: occasionally  .  Drug use: No  . Sexual activity: Yes    Birth control/protection: None  Other Topics Concern  . Not on file  Social History Narrative  . Not on file   Social Determinants of Health   Financial Resource Strain:   . Difficulty of Paying Living Expenses:   Food Insecurity:   . Worried About Charity fundraiser in the Last Year:   . Arboriculturist in the Last Year:   Transportation Needs:   . Film/video editor (Medical):   Marland Kitchen Lack of Transportation (Non-Medical):   Physical Activity:   . Days of Exercise per Week:   . Minutes of Exercise per Session:   Stress:   . Feeling of Stress :   Social Connections:   . Frequency of Communication with Friends and Family:   . Frequency of Social Gatherings with Friends and Family:   . Attends Religious Services:   . Active Member of Clubs or Organizations:   . Attends Archivist Meetings:   Marland Kitchen Marital Status:   Intimate Partner Violence:   . Fear of Current or Ex-Partner:   . Emotionally Abused:   Marland Kitchen Physically Abused:   . Sexually Abused:     Allergies:  No Known Allergies  Medications: Prior to Admission medications   Medication Sig Start Date End Date Taking? Authorizing Provider  acetaminophen (TYLENOL) 325 MG tablet Take  2 tablets (650 mg total) by mouth every 4 (four) hours as needed for mild pain or moderate pain (for pain scale < 4). 08/15/19  Yes Dalia Heading, CNM  ibuprofen (ADVIL) 600 MG tablet Take 1 tablet (600 mg total) by mouth every 6 (six) hours as needed for moderate pain or cramping. 08/15/19  Yes Dalia Heading, CNM  levonorgestrel (MIRENA) 20 MCG/24HR IUD 1 each by Intrauterine route once.   Yes [provider]  Prenatal Vit-Fe Fumarate-FA (MULTIVITAMIN-PRENATAL) 27-0.8 MG TABS tablet Take 1 tablet by mouth daily at 12 noon.   Yes [provider]  sertraline (ZOLOFT) 50 MG tablet Take 50 mg by mouth daily.   Yes [provider]    Physical Exam Blood pressure  112/64, height 5\' 4"  (1.626 m), weight 156 lb (70.8 kg), currently breastfeeding. No LMP recorded. (Menstrual status: IUD).   General: NAD HEENT: normocephalic, anicteric Pulmonary: No increased work of breathing Genitourinary:  External: Normal external female genitalia.  Normal urethral meatus, normal  Bartholin's and Skene's glands.    Vagina: Normal vaginal mucosa, no evidence of prolapse.    Cervix: Grossly normal in appearance, no bleeding, IUD strings visualized 2cm  Uterus: Non-enlarged, mobile, normal contour.  No CMT  Adnexa: ovaries non-enlarged, no adnexal masses  Rectal: deferred  Lymphatic: no evidence of inguinal lymphadenopathy Extremities: no edema, erythema, or tenderness Neurologic: Grossly intact Psychiatric: mood appropriate, affect full  Female chaperone present for pelvic and breast  portions of the physical exam  Assessment: 29 y.o. G1P1001 IUD string check  Plan: Problem List Items Addressed This Visit      Other   IUD contraception       1.  The patient was given instructions to check her IUD strings monthly and call with any problems or concerns.  She should call for fevers, chills, abnormal vaginal discharge, pelvic pain, or other complaints.  2.   IUDs while effective at preventing pregnancy do not prevent transmission of sexually transmitted diseases and use of barrier methods for this purpose was discussed.  Low overall incidence of failure with 99.7% efficacy rate in typical use.  The patient has not contraindication to IUD placement.  3.  She will return for a annual exam in 1 year.  All questions answered.  4) A total of 15 minutes were spent in face-to-face contact with the patient during this encounter with over half of that time devoted to counseling and coordination of care.  5) Return in about 1 year (around 11/15/2020) for annual.   Malachy Mood, MD, [place], [facility] 11/22/2019, 8:07 AM

## 2021-02-11 ENCOUNTER — Ambulatory Visit (INDEPENDENT_AMBULATORY_CARE_PROVIDER_SITE_OTHER): Payer: Managed Care, Other (non HMO) | Admitting: Obstetrics and Gynecology

## 2021-02-11 ENCOUNTER — Other Ambulatory Visit: Payer: Self-pay

## 2021-02-11 ENCOUNTER — Encounter: Payer: Self-pay | Admitting: Obstetrics and Gynecology

## 2021-02-11 VITALS — BP 100/70 | Ht 64.0 in | Wt 133.0 lb

## 2021-02-11 DIAGNOSIS — N921 Excessive and frequent menstruation with irregular cycle: Secondary | ICD-10-CM

## 2021-02-11 DIAGNOSIS — Z30431 Encounter for routine checking of intrauterine contraceptive device: Secondary | ICD-10-CM | POA: Diagnosis not present

## 2021-02-11 DIAGNOSIS — Z975 Presence of (intrauterine) contraceptive device: Secondary | ICD-10-CM | POA: Diagnosis not present

## 2021-02-11 LAB — POCT URINE PREGNANCY: Preg Test, Ur: NEGATIVE

## 2021-02-11 NOTE — Progress Notes (Signed)
Maria Post, PA-C   Chief Complaint  Patient presents with   IUD check    Had a 3 day period, first time since IUD insertion, cramping also    HPI:      [name] is a 30 y.o. G1P1001 whose LMP was No LMP recorded. (Menstrual status: IUD)., presents today for bleeding with IUD. Sx were light and lasted 3 days, mild dysmen, no meds needed. Mirena placed 09/28/19 at 6 wk PP visit, has been amenorrheic since. No BTB, no pelvic pain. No vag, urin, GI sx. No LBP, pelvic pain, fevers.  She is sex active, no new partners. No pain/bleeding. Neg STD testing 1/21 Last pap was neg 1/20. No recent annual.   Past Medical History:  Diagnosis Date   ADHD (attention deficit hyperactivity disorder)    Anxiety     Past Surgical History:  Procedure Laterality Date   LAPAROSCOPIC OVARIAN CYSTECTOMY Left 03/01/2016   Procedure: LAPAROSCOPIC OVARIAN CYSTECTOMY;  Surgeon: Benjaman Kindler, MD;  Location: [facility] ORS;  Service: Gynecology;  Laterality: Left;   LAPAROSCOPY N/A 03/01/2016   Procedure: LAPAROSCOPY DIAGNOSTIC;  Surgeon: Benjaman Kindler, MD;  Location: [facility] OR[facility];  Service: Gynecology;  Laterality: N/A;   Mirena insertion  09/28/2019   WISDOM TOOTH EXTRACTION     WISDOM TOOTH EXTRACTION      Family History  Problem Relation Age of Onset   Healthy Mother    Healthy Father     Social History   Socioeconomic History   Marital status: Married    Spouse name: Not on file   Number of children: Not on file   Years of education: Not on file   Highest education level: Not on file  Occupational History   Not on file  Tobacco Use   Smoking status: Never   Smokeless tobacco: Never  Vaping Use   Vaping Use: Never used  Substance and Sexual Activity   Alcohol use: Yes    Alcohol/week: 2.0 standard drinks    Types: 1 Glasses of wine, 1 Cans of beer per week    Comment: occasionally   Drug use: No   Sexual activity: Yes    Birth control/protection: I.U.D.    Comment: Mirena   Other Topics Concern   Not on file  Social History Narrative   Not on file   Social Determinants of Health   Financial Resource Strain: Not on file  Food Insecurity: Not on file  Transportation Needs: Not on file  Physical Activity: Not on file  Stress: Not on file  Social Connections: Not on file  Intimate Partner Violence: Not on file    Outpatient Medications Prior to Visit  Medication Sig Dispense Refill   levonorgestrel (MIRENA) 20 MCG/24HR IUD 1 each by Intrauterine route once.     Prenatal Vit-Fe Fumarate-FA (MULTIVITAMIN-PRENATAL) 27-0.8 MG TABS tablet Take 1 tablet by mouth daily at 12 noon.     sertraline (ZOLOFT) 50 MG tablet Take 50 mg by mouth daily.     acetaminophen (TYLENOL) 325 MG tablet Take 2 tablets (650 mg total) by mouth every 4 (four) hours as needed for mild pain or moderate pain (for pain scale < 4). 50 tablet 1   ibuprofen (ADVIL) 600 MG tablet Take 1 tablet (600 mg total) by mouth every 6 (six) hours as needed for moderate pain or cramping. 30 tablet 0   No facility-administered medications prior to visit.    ROS:  Review of Systems  Constitutional:  Negative for fever.  Gastrointestinal:  Negative for blood in stool, constipation, diarrhea, nausea and vomiting.  Genitourinary:  Positive for vaginal bleeding. Negative for dyspareunia, dysuria, flank pain, frequency, hematuria, urgency, vaginal discharge and vaginal pain.  Musculoskeletal:  Negative for back pain.  Skin:  Negative for rash.  BREAST: No symptoms   OBJECTIVE:   Vitals:  BP 100/70   Ht 5\' 4"  (1.626 m)   Wt 133 lb (60.3 kg)   Breastfeeding Yes   BMI 22.83 kg/m   Physical Exam Vitals reviewed.  Constitutional:      Appearance: She is well-developed.  Pulmonary:     Effort: Pulmonary effort is normal.  Genitourinary:    General: Normal vulva.     Pubic Area: No rash.      Labia:        Right: No rash, tenderness or lesion.        Left: No rash, tenderness or lesion.       Vagina: Normal. No vaginal discharge, erythema or tenderness.     Cervix: Normal.     Uterus: Normal. Not enlarged and not tender.      Adnexa: Right adnexa normal and left adnexa normal.       Right: No mass or tenderness.         Left: No mass or tenderness.       Comments: IUD STRINGS IN CX OS Musculoskeletal:        General: Normal range of motion.     Cervical back: Normal range of motion.  Skin:    General: Skin is warm and dry.  Neurological:     General: No focal deficit present.     Mental Status: She is alert and oriented to person, place, and time.  Psychiatric:        Mood and Affect: Mood normal.        Behavior: Behavior normal.        Thought Content: Thought content normal.        Judgment: Judgment normal.    Results: Results for orders placed or performed in visit on 02/11/21 (from the past 24 hour(s))  POCT urine pregnancy     Status: Normal   Collection Time: 02/11/21 12:14 PM  Result Value Ref Range   Preg Test, Ur Negative Negative     Assessment/Plan: Breakthrough bleeding with IUD - Plan: POCT urine pregnancy; neg UPT. Reassurance. Sx were normal for menses with IUD. F/u prn AUB.   Encounter for routine checking of intrauterine contraceptive device (IUD)    Return in about 6 months (around 02/11/9469) for annual.  Quetzal Meany B. Kandon Hosking, PA-C 02/11/2021 12:14 PM

## 2021-05-15 ENCOUNTER — Encounter: Payer: Self-pay | Admitting: Family Medicine

## 2021-05-15 ENCOUNTER — Other Ambulatory Visit: Payer: Self-pay

## 2021-05-15 ENCOUNTER — Ambulatory Visit (INDEPENDENT_AMBULATORY_CARE_PROVIDER_SITE_OTHER): Payer: Managed Care, Other (non HMO) | Admitting: Family Medicine

## 2021-05-15 VITALS — BP 113/76 | HR 87 | Temp 97.3°F | Resp 16 | Wt 134.7 lb

## 2021-05-15 DIAGNOSIS — F419 Anxiety disorder, unspecified: Secondary | ICD-10-CM | POA: Diagnosis not present

## 2021-05-15 DIAGNOSIS — Z23 Encounter for immunization: Secondary | ICD-10-CM

## 2021-05-15 MED ORDER — SERTRALINE HCL 50 MG PO TABS: 50.0000 mg | ORAL_TABLET | Freq: Every day | ORAL | 3 refills | Status: DC

## 2021-05-15 NOTE — Progress Notes (Signed)
Established patient visit   Patient: Maria [name]   DOB: [birth year]   30 y.o. Female  MRN: [id number] Visit Date: 05/15/2021  Today's healthcare provider: Gwyneth Sprout, FNP   Chief Complaint  Patient presents with   Anxiety   Subjective    HPI  Anxiety, Follow-up  She was last seen for anxiety 3 years ago. Changes made at last visit include none.   She reports excellent compliance with treatment. She reports excellent tolerance of treatment. She is not having side effects.   She feels her anxiety is mild and Improved since last visit.  Symptoms: No chest pain Yes difficulty concentrating  Yes dizziness Yes fatigue  No feelings of losing control No insomnia  No irritable No palpitations  No panic attacks No racing thoughts  No shortness of breath No sweating  No tremors/shakes    GAD-7 Results No flowsheet data found.  PHQ-9 Scores No flowsheet data found.  ---------------------------------------------------------------------------------------------------   Medications: Outpatient Medications Prior to Visit  Medication Sig   levonorgestrel (MIRENA) 20 MCG/24HR IUD 1 each by Intrauterine route once.   Prenatal Vit-Fe Fumarate-FA (MULTIVITAMIN-PRENATAL) 27-0.8 MG TABS tablet Take 1 tablet by mouth daily at 12 noon.   [DISCONTINUED] sertraline (ZOLOFT) 50 MG tablet Take 50 mg by mouth daily.   No facility-administered medications prior to visit.    Review of Systems     Objective    BP 113/76   Pulse 87   Temp (!) 97.3 F (36.3 C) (Oral)   Resp 16   Wt 134 lb 11.2 oz (61.1 kg)   SpO2 100%   BMI 23.12 kg/m    Physical Exam Vitals and nursing note reviewed.  Constitutional:      General: She is not in acute distress.    Appearance: Normal appearance. She is normal weight. She is not ill-appearing, toxic-appearing or diaphoretic.  HENT:     Head: Normocephalic and atraumatic.  Cardiovascular:     Rate and Rhythm: Normal rate and  regular rhythm.     Pulses: Normal pulses.     Heart sounds: Normal heart sounds. No murmur heard.   No friction rub. No gallop.  Pulmonary:     Effort: Pulmonary effort is normal. No respiratory distress.     Breath sounds: Normal breath sounds. No stridor. No wheezing, rhonchi or rales.  Chest:     Chest wall: No tenderness.  Abdominal:     General: Bowel sounds are normal.     Palpations: Abdomen is soft.  Musculoskeletal:        General: No swelling, tenderness, deformity or signs of injury. Normal range of motion.     Right lower leg: No edema.     Left lower leg: No edema.  Skin:    General: Skin is warm and dry.     Capillary Refill: Capillary refill takes less than 2 seconds.     Coloration: Skin is not jaundiced or pale.     Findings: No bruising, erythema, lesion or rash.  Neurological:     General: No focal deficit present.     Mental Status: She is alert and oriented to person, place, and time. Mental status is at baseline.     Cranial Nerves: No cranial nerve deficit.     Sensory: No sensory deficit.     Motor: No weakness.     Coordination: Coordination normal.  Psychiatric:        Mood and Affect: Mood normal.  Behavior: Behavior normal.        Thought Content: Thought content normal.        Judgment: Judgment normal.     No results found for any visits on 05/15/21.  Assessment & Plan     Problem List Items Addressed This Visit       Other   Anxiety - Primary   Relevant Medications   sertraline (ZOLOFT) 50 MG tablet   Need for influenza vaccination   Relevant Orders   Flu Vaccine QUAD 19mo+IM (Fluarix, Fluzone & Alfiuria Quad PF)     Return in about 1 year (around 05/15/2022) for annual examination, anxiety and depression.      Vonna Kotyk, FNP, have reviewed all documentation for this visit. The documentation on 05/15/21 for the exam, diagnosis, procedures, and orders are all accurate and complete.    Gwyneth Sprout, [place] [phone] (phone) [phone] (fax)  [place]

## 2021-06-26 NOTE — Telephone Encounter (Signed)
Mirena rcvd/charged 09/28/2019

## 2021-10-25 ENCOUNTER — Emergency Department
Admission: EM | Admit: 2021-10-25 | Discharge: 2021-10-25 | Disposition: A | Discharge status: Home / Self Care | Payer: BC Managed Care – PPO | Attending: Emergency Medicine | Admitting: Emergency Medicine

## 2021-10-25 ENCOUNTER — Other Ambulatory Visit: Payer: Self-pay

## 2021-10-25 ENCOUNTER — Emergency Department: Payer: BC Managed Care – PPO

## 2021-10-25 DIAGNOSIS — R200 Anesthesia of skin: Secondary | ICD-10-CM | POA: Insufficient documentation

## 2021-10-25 DIAGNOSIS — R112 Nausea with vomiting, unspecified: Secondary | ICD-10-CM | POA: Insufficient documentation

## 2021-10-25 DIAGNOSIS — Z20822 Contact with and (suspected) exposure to covid-19: Secondary | ICD-10-CM | POA: Diagnosis not present

## 2021-10-25 DIAGNOSIS — R0789 Other chest pain: Secondary | ICD-10-CM | POA: Insufficient documentation

## 2021-10-25 DIAGNOSIS — R110 Nausea: Secondary | ICD-10-CM | POA: Diagnosis not present

## 2021-10-25 DIAGNOSIS — R824 Acetonuria: Secondary | ICD-10-CM | POA: Diagnosis not present

## 2021-10-25 DIAGNOSIS — D72829 Elevated white blood cell count, unspecified: Secondary | ICD-10-CM | POA: Diagnosis not present

## 2021-10-25 LAB — BASIC METABOLIC PANEL
Anion gap: 7 (ref 5–15)
BUN: 15 mg/dL (ref 6–20)
CO2: 26 mmol/L (ref 22–32)
Calcium: 9 mg/dL (ref 8.9–10.3)
Chloride: 106 mmol/L (ref 98–111)
Creatinine, Ser: 0.48 mg/dL (ref 0.44–1.00)
GFR, Estimated: >60 mL/min (ref >60)
Glucose, Bld: 98 mg/dL (ref 70–99)
Potassium: 3.9 mmol/L (ref 3.5–5.1)
Sodium: 139 mmol/L (ref 135–145)

## 2021-10-25 LAB — CBC
HCT: 39.7 % (ref 36.0–46.0)
Hemoglobin: 13.7 g/dL (ref 12.0–15.0)
MCH: 30.1 pg (ref 26.0–34.0)
MCHC: 34.5 g/dL (ref 30.0–36.0)
MCV: 87.3 fL (ref 80.0–100.0)
Platelets: 235 10*3/uL (ref 150–400)
RBC: 4.55 MIL/uL (ref 3.87–5.11)
RDW: 12.5 % (ref 11.5–15.5)
WBC: 16.5 10*3/uL — ABNORMAL HIGH (ref 4.0–10.5)
nRBC: 0 % (ref 0.0–0.2)

## 2021-10-25 LAB — URINALYSIS, ROUTINE W REFLEX MICROSCOPIC
Bilirubin Urine: NEGATIVE
Glucose, UA: NEGATIVE mg/dL
Hgb urine dipstick: NEGATIVE
Ketones, ur: 5 mg/dL — AB
Leukocytes,Ua: NEGATIVE
Nitrite: NEGATIVE
Protein, ur: 30 mg/dL — AB
Specific Gravity, Urine: 1.027 (ref 1.005–1.030)
pH: 7 (ref 5.0–8.0)

## 2021-10-25 LAB — HEPATIC FUNCTION PANEL
ALT: 18 U/L (ref 0–44)
AST: 20 U/L (ref 15–41)
Albumin: 3.9 g/dL (ref 3.5–5.0)
Alkaline Phosphatase: 72 U/L (ref 38–126)
Bilirubin, Direct: 0.1 mg/dL (ref 0.0–0.2)
Indirect Bilirubin: 0.5 mg/dL (ref 0.3–0.9)
Total Bilirubin: 0.6 mg/dL (ref 0.3–1.2)
Total Protein: 6.7 g/dL (ref 6.5–8.1)

## 2021-10-25 LAB — TROPONIN I (HIGH SENSITIVITY)
Troponin I (High Sensitivity): 2 ng/L (ref <18)
Troponin I (High Sensitivity): 2 ng/L (ref <18)

## 2021-10-25 LAB — RESP PANEL BY RT-PCR (FLU A&B, COVID) ARPGX2
Influenza A by PCR: NEGATIVE
Influenza B by PCR: NEGATIVE
SARS Coronavirus 2 by RT PCR: NEGATIVE

## 2021-10-25 LAB — PREGNANCY, URINE: Preg Test, Ur: NEGATIVE

## 2021-10-25 LAB — LIPASE, BLOOD: Lipase: 38 U/L (ref 11–51)

## 2021-10-25 MED ORDER — ONDANSETRON 4 MG PO TBDP: 4.0000 mg | ORAL_TABLET | Freq: Three times a day (TID) | ORAL | 0 refills | Status: DC | PRN

## 2021-10-25 MED ORDER — ONDANSETRON HCL 4 MG/2ML IJ SOLN
4.0000 mg | Freq: Once | INTRAMUSCULAR | Status: AC
  Administered 2021-10-25: 4 mg via INTRAVENOUS
  Filled 2021-10-25: qty 2

## 2021-10-25 MED ORDER — LIDOCAINE 5 % EX PTCH: 1.0000 | MEDICATED_PATCH | Freq: Two times a day (BID) | CUTANEOUS | 0 refills | Status: AC

## 2021-10-25 MED ORDER — ACETAMINOPHEN 500 MG PO TABS
1000.0000 mg | ORAL_TABLET | Freq: Once | ORAL | Status: AC
  Administered 2021-10-25: 1000 mg via ORAL
  Filled 2021-10-25: qty 2

## 2021-10-25 MED ORDER — LIDOCAINE 5 % EX PTCH
1.0000 | MEDICATED_PATCH | CUTANEOUS | Status: DC
  Administered 2021-10-25: 1 via TRANSDERMAL
  Filled 2021-10-25: qty 1

## 2021-10-25 MED ORDER — SODIUM CHLORIDE 0.9 % IV BOLUS
1000.0000 mL | Freq: Once | INTRAVENOUS | Status: AC
  Administered 2021-10-25: 1000 mL via INTRAVENOUS

## 2021-10-25 NOTE — Discharge Instructions (Addendum)
Your work-up was reassuring but you can return to the ER if symptoms are worsening you develop fevers, worsening chest pain or any other concerns ?

## 2021-10-25 NOTE — ED Triage Notes (Signed)
Patient reports woke around 4:30 am feeling nauseated, vomited.  Around the same time noticed chest pain with left arm numbness. ?

## 2021-10-25 NOTE — ED Notes (Signed)
Pt presents to ED with c/o of chest pain with radiation down L arm. Pt states she was awake when this happened. Pt states HX of anxiety. Pt states 2 episodes of vomiting as well. Pt denies any fevers or chills. Pt states LMP 3 days ago. Pt still breastfeeding at this time. Pt denies cardiac HX. NAD noted. VSS.  ?

## 2021-10-25 NOTE — ED Notes (Signed)
Pt given graham crackers and ginger ale for PO challenge. ? ?Pt educated to keep arm straight so IVF can infuse faster, blanket placed underneath elbow to help with this.  ?

## 2021-10-25 NOTE — ED Provider Notes (Signed)
? ?[facility] ?Provider Note ? ? ? Event Date/Time  ? First MD Initiated Contact with Patient 10/25/21 [phone]   ?  (approximate) ? ? ?History  ? ?Chest Pain ? ? ?HPI ? ?Maria [name] is a 31 y.o. female who is otherwise healthy with IUD in place who is currently breast-feeding her 15-year-old who comes in with concerns for vomiting.  Patient woke up around 4:30 AM feeling nauseous and had an episode of vomiting.  Then afterwards she developed some pain in her left-sided chest arm area that radiated down her left arm.  Triage note mentions numbness but more just pain that is wrapping down at night per patient.  She does not report any significant changes in sensation bilaterally.  She states that currently her pain is  4 out of 10 is come down.  However she has had 2-3 more additional vomiting episodes afterward.  Has been nonbloody nonbilious.  She denies any abdominal pain.  She denies anybody else being sick at home.  She denies any diarrhea.  She reports still feeling nauseous now.  She does report history of anxiety and that that has high into her as well but denies no shortness of breath, leg swelling, calf tenderness or other concerns ? ? ? ?Physical Exam  ? ?Triage Vital Signs: ?ED Triage Vitals  ?Enc Vitals Group  ?   BP 10/25/21 0647 111/73  ?   Pulse Rate 10/25/21 0647 (!) 102  ?   Resp 10/25/21 0647 16  ?   Temp 10/25/21 0647 98.5 ?F (36.9 ?C)  ?   Temp Source 10/25/21 0647 Oral  ?   SpO2 10/25/21 0647 94 %  ?   Weight 10/25/21 0647 135 lb (61.2 kg)  ?   Height 10/25/21 0647 '5\' 4"'$  (1.626 m)  ?   Head Circumference --   ?   Peak Flow --   ?   Pain Score 10/25/21 0652 5  ?   Pain Loc --   ?   Pain Edu? --   ?   Excl. in [place]? --   ? ? ?Most recent vital signs: ?Vitals:  ? 10/25/21 0647  ?BP: 111/73  ?Pulse: (!) 102  ?Resp: 16  ?Temp: 98.5 ?F (36.9 ?C)  ?SpO2: 94%  ? ? ? ?General: Awake, no distress.  ?CV:  Good peripheral perfusion.  No chest wall tenderness or crepitus. ?Resp:  Normal  effort.  Clear lungs ?Abd:  No distention.  Soft and nontender ?Other:  Equal strength in arms and legs with sensation intact bilaterally.  No calf tenderness or calf swelling ? ? ?ED Results / Procedures / Treatments  ? ?Labs ?(all labs ordered are listed, but only abnormal results are displayed) ?Labs Reviewed  ?CBC - Abnormal; Notable for the following components:  ?    Result Value  ? WBC 16.5 (*)   ? All other components within normal limits  ?BASIC METABOLIC PANEL  ?POC URINE PREG, ED  ?TROPONIN I (HIGH SENSITIVITY)  ? ? ? ?EKG ? ?My interpretation of EKG: ? ?Normal sinus rhythm 98 without any ST elevation T wave version lead III, normal intervals ? ?RADIOLOGY ?I have reviewed the xray personally and no no evidence of pneumothorax or widened mediastinum ? ? ?PROCEDURES: ? ?Critical Care performed: No ? ?.1-3 Lead EKG Interpretation ?Performed by: Vanessa Oso, MD ?Authorized by: Vanessa [place], MD  ? ?  Interpretation: normal   ?  ECG rate:  90 ?  ECG rate assessment: normal   ?  Rhythm: sinus rhythm   ?  Ectopy: none   ?  Conduction: normal   ? ? ?MEDICATIONS ORDERED IN ED: ?Medications  ?lidocaine (LIDODERM) 5 % 1 patch (1 patch Transdermal Patch Applied 10/25/21 0801)  ?ondansetron [facility]) injection 4 mg (4 mg Intravenous Given 10/25/21 0727)  ?sodium chloride 0.9 % bolus 1,000 mL (1,000 mLs Intravenous Bolus 10/25/21 0732)  ?acetaminophen (TYLENOL) tablet 1,000 mg (1,000 mg Oral Given 10/25/21 0731)  ? ? ? ?IMPRESSION / MDM / ASSESSMENT AND PLAN / ED COURSE  ?I reviewed the triage vital signs and the nursing notes. ?             ?               ? ?Patient comes in with sudden onset of vomiting that started overnight which then led into some chest pain and heightening her anxiety.  Patient is otherwise very well-appearing.  We will get pregnancy test, give some IV fluids, IV Zofran, Tylenol to help with symptoms.  No abdominal pain to suggest cholecystitis or abdominal pathology.  Chest x-ray to evaluate for  pneumothorax.  Does not sound like dissection.  Cardiac markers and EKG to evaluate for ACS.  No shortness of breath to suggest PE ? ?When I reevaluated patient around 830 patient reports resolution of symptoms ? ?Cardiac markers are negative x2, COVID, flu are negative, urine with some mild amount of ketones but patient getting fluids.  No evidence of UTI.  Pregnancy test was negative.  CMP is reassuring.  White count is elevated at 16 but otherwise does not meet any sepsis criteria and could just be reactive secondary to the vomiting. ? ?I repeat abdominal exam she remains soft and nontender I very low suspicion for appendicitis, diverticulitis, ulcer perforation.  She has no significant chest pain and no numbness or tingling.  I very low suspicion for dissection or esophageal perforation at this time given how well-appearing with normal vital signs. ? ?Discussed with patient she most likely had viral GI bug that then led to some chest pain from the muscular straining of the vomiting.  At this time she reports near resolution of symptoms and feels comfortable with discharge home but will return if she develops worsening pain, fevers or any other concern. ? ?I considered admission for patient given she came in with chest pain and vomiting but given her EKG and cardiac markers are negative and patient is tolerating p.o. I think is reasonable for patient be discharged home and return if symptoms are worsening ? ?The patient is on the cardiac monitor to evaluate for evidence of arrhythmia and/or significant heart rate changes. ? ?FINAL CLINICAL IMPRESSION(S) / ED DIAGNOSES  ? ?Final diagnoses:  ?Atypical chest pain  ?Nausea and vomiting, unspecified vomiting type  ? ? ? ?Rx / DC Orders  ? ?ED Discharge Orders   ? ?      Ordered  ?  ondansetron (ZOFRAN-ODT) 4 MG disintegrating tablet  Every 8 hours PRN       ? 10/25/21 1032  ?  lidocaine (LIDODERM) 5 %  Every 12 hours       ? 10/25/21 1032  ? ?  ?  ? ?  ? ? ? ?Note:   This document was prepared using Dragon voice recognition software and may include unintentional dictation errors. ?  ?Vanessa [place], MD ?10/25/21 1032 ? ?

## 2021-10-26 ENCOUNTER — Encounter: Payer: Self-pay | Admitting: Family Medicine

## 2021-10-26 ENCOUNTER — Ambulatory Visit (INDEPENDENT_AMBULATORY_CARE_PROVIDER_SITE_OTHER): Payer: BC Managed Care – PPO | Admitting: Family Medicine

## 2021-10-26 VITALS — BP 105/74 | HR 101 | Temp 98.6°F | Resp 16 | Ht 64.0 in | Wt 135.4 lb

## 2021-10-26 DIAGNOSIS — R112 Nausea with vomiting, unspecified: Secondary | ICD-10-CM | POA: Insufficient documentation

## 2021-10-26 DIAGNOSIS — M79602 Pain in left arm: Secondary | ICD-10-CM | POA: Diagnosis not present

## 2021-10-26 DIAGNOSIS — Z09 Encounter for follow-up examination after completed treatment for conditions other than malignant neoplasm: Secondary | ICD-10-CM | POA: Diagnosis not present

## 2021-10-26 MED ORDER — ONDANSETRON 4 MG PO TBDP: 4.0000 mg | ORAL_TABLET | Freq: Three times a day (TID) | ORAL | 0 refills | Status: DC | PRN

## 2021-10-26 NOTE — Assessment & Plan Note (Addendum)
Acute, stable ?Likely viral in nature- should resolve within 96 hrs. ? ?Will refill Zofran, as they have improved patient's nausea. Continue to recommend small, bland food choices and adequate non-caffeinated beverages. ? ?No further vomiting today; was able to eat this morning and keep food/drink down. ? ?Slight tachycardia noted- likely fluid based, anxiety.  ?

## 2021-10-26 NOTE — Assessment & Plan Note (Signed)
Seen at [facility] ED for nausea/vomiting with associated atypical chest pain and arm pain ?Review of stay showed: ?-labs normal, with exception of WBC at 16 ?-normal EKG ?-negative infectious disease ?Reassurance provided that symptoms are likely viral in nature; community exposure as source of infection.  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 ? ? ? ?

## 2021-10-26 NOTE — Assessment & Plan Note (Signed)
Acute, no mechanism of injury ?Noted following vomiting episodes ?Assume MSK in nature ?OK to use 1/2 of previously prescribed lidocaine patch at site to assist with NSAIDs and APAP. ?Will seek imaging if not improved. ?

## 2021-10-26 NOTE — Progress Notes (Signed)
?  ? ?I,Jana Robinson,acting as a scribe for Gwyneth Sprout, FNP.,have documented all relevant documentation on the behalf of Gwyneth Sprout, FNP,as directed by  Gwyneth Sprout, FNP while in the presence of Gwyneth Sprout, FNP.  ? ?Established patient visit ? ? ?Patient: Maria [name]   DOB: [birth year]   31 y.o. Female  MRN: [id number] ?Visit Date: 10/26/2021 ? ?Today's healthcare provider: Gwyneth Sprout, FNP  ? ?Re Introduced to nurse practitioner role and practice setting.  All questions answered.  Discussed provider/patient relationship and expectations. ? ?Chief Complaint  ?Patient presents with  ? Follow-up  ?  ER visit 10-25-21  ? ?Subjective  ?  ?Patient presents for follow up from ED visit yesterday 10-25-21 for nausea and episodes of vomiting around 4:00a.m. Afterwards reports left-sided chest pain with radiation down left arm. ?EKG and chest x-ray at ER.  Patient was told she had a high white blood count.  ?When patient returned home nausea and dizziness continued with paleness of skin and hands looked a yellow-ish color.  Patient reports she stayed in bed the remainder of the day.   ?Reports feels better today with a little bit of weakness and periods of slight dizziness.  No chest pain today.  Reports left arm have a throbbing pain at forearm.   ? ?Medications: ?Outpatient Medications Prior to Visit  ?Medication Sig  ? levonorgestrel (MIRENA) 20 MCG/24HR IUD 1 each by Intrauterine route once.  ? lidocaine (LIDODERM) 5 % Place 1 patch onto the skin every 12 (twelve) hours for 5 days. Remove & Discard patch within 12 hours or as directed by MD  ? Prenatal Vit-Fe Fumarate-FA (MULTIVITAMIN-PRENATAL) 27-0.8 MG TABS tablet Take 1 tablet by mouth daily at 12 noon.  ? sertraline (ZOLOFT) 50 MG tablet Take 1 tablet (50 mg total) by mouth daily.  ? [DISCONTINUED] ondansetron (ZOFRAN-ODT) 4 MG disintegrating tablet Take 1 tablet (4 mg total) by mouth every 8 (eight) hours as needed for up to 3 days for nausea or vomiting.   ? ?No facility-administered medications prior to visit.  ? ? ?Review of Systems ? ? ?  Objective  ?  ?BP 105/74 (BP Location: Left Arm, Patient Position: Sitting, Cuff Size: Normal)   Pulse (!) 101   Temp 98.6 ?F (37 ?C) (Oral)   Resp 16   Ht '5\' 4"'$  (1.626 m)   Wt 135 lb 6.4 oz (61.4 kg)   LMP 10/18/2021 (Exact Date)   SpO2 99%   BMI 23.24 kg/m?  ? ? ?Physical Exam ?Vitals and nursing note reviewed.  ?Constitutional:   ?   General: She is not in acute distress. ?   Appearance: Normal appearance. She is normal weight. She is not ill-appearing, toxic-appearing or diaphoretic.  ?HENT:  ?   Head: Normocephalic and atraumatic.  ?Cardiovascular:  ?   Rate and Rhythm: Regular rhythm. Tachycardia present.  ?   Pulses: Normal pulses.  ?   Heart sounds: Normal heart sounds. No murmur heard. ?  No friction rub. No gallop.  ?Pulmonary:  ?   Effort: Pulmonary effort is normal. No respiratory distress.  ?   Breath sounds: Normal breath sounds. No stridor. No wheezing, rhonchi or rales.  ?Chest:  ?   Chest wall: No tenderness.  ?Abdominal:  ?   General: Bowel sounds are normal.  ?   Palpations: Abdomen is soft.  ?Musculoskeletal:     ?   General: No swelling, tenderness, deformity or signs of injury. Normal  range of motion.  ?   Right lower leg: No edema.  ?   Left lower leg: No edema.  ?Skin: ?   General: Skin is warm and dry.  ?   Capillary Refill: Capillary refill takes less than 2 seconds.  ?   Coloration: Skin is not jaundiced or pale.  ?   Findings: No bruising, erythema, lesion or rash.  ?Neurological:  ?   General: No focal deficit present.  ?   Mental Status: She is alert and oriented to person, place, and time. Mental status is at baseline.  ?   Cranial Nerves: No cranial nerve deficit.  ?   Sensory: No sensory deficit.  ?   Motor: No weakness.  ?   Coordination: Coordination normal.  ?Psychiatric:     ?   Mood and Affect: Mood is anxious.     ?   Behavior: Behavior normal.     ?   Thought Content: Thought  content normal.     ?   Judgment: Judgment normal.  ?  ? ?No results found for any visits on 10/26/21. ? Assessment & Plan  ?  ? ?Problem List Items Addressed This Visit   ? ?  ? Digestive  ? Nausea and vomiting  ?  Acute, stable ?Likely viral in nature- should resolve within 96 hrs. ? ?Will refill Zofran, as they have improved patient's nausea. Continue to recommend small, bland food choices and adequate non-caffeinated beverages. ? ?No further vomiting today; was able to eat this morning and keep food/drink down. ? ?Slight tachycardia noted- likely fluid based, anxiety.  ?  ?  ? Relevant Medications  ? ondansetron (ZOFRAN-ODT) 4 MG disintegrating tablet  ?  ? Other  ? Hospital discharge follow-up - Primary  ?  Seen at [facility] ED for nausea/vomiting with associated atypical chest pain and arm pain ?Review of stay showed: ?-labs normal, with exception of WBC at 16 ?-normal EKG ?-negative infectious disease ?Reassurance provided that symptoms are likely viral in nature; community exposure as source of infection.  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 ? ? ? ?  ?  ? Pain of left upper extremity  ?  Acute, no mechanism of injury ?Noted following vomiting episodes ?Assume MSK in nature ?OK to use 1/2 of previously prescribed lidocaine patch at site to assist with NSAIDs and APAP. ?Will seek imaging if not improved. ?  ?  ? ? ?Return if symptoms worsen or fail to improve.  ?   ? ?I, Gwyneth Sprout, FNP, have reviewed all documentation for this visit. The documentation on 10/26/21 for the exam, diagnosis, procedures, and orders are all accurate and complete. ? ? ? ? ?Gwyneth Sprout, FNP  ?[place] ?[phone] (phone) ?[phone] (fax) ? ?[facility] ?

## 2022-05-09 ENCOUNTER — Other Ambulatory Visit: Payer: Self-pay | Admitting: Family Medicine

## 2022-05-09 DIAGNOSIS — F419 Anxiety disorder, unspecified: Secondary | ICD-10-CM

## 2022-05-14 ENCOUNTER — Encounter: Payer: Self-pay | Admitting: Family Medicine

## 2022-05-14 ENCOUNTER — Ambulatory Visit (INDEPENDENT_AMBULATORY_CARE_PROVIDER_SITE_OTHER): Payer: BC Managed Care – PPO | Admitting: Family Medicine

## 2022-05-14 VITALS — BP 107/75 | HR 84 | Resp 16 | Ht 64.0 in | Wt 137.0 lb

## 2022-05-14 DIAGNOSIS — F419 Anxiety disorder, unspecified: Secondary | ICD-10-CM

## 2022-05-14 DIAGNOSIS — Z23 Encounter for immunization: Secondary | ICD-10-CM

## 2022-05-14 MED ORDER — SERTRALINE HCL 50 MG PO TABS: 50.0000 mg | ORAL_TABLET | Freq: Every day | ORAL | 3 refills | Status: DC

## 2022-05-14 NOTE — Progress Notes (Signed)
Established patient visit   Patient: Maria [name]   DOB: 18-Apr-1991   31 y.o. Female  MRN: [id number] Visit Date: 05/14/2022  Today's healthcare provider: Gwyneth Sprout, FNP   I,Tiffany J Bragg,acting as a scribe for Gwyneth Sprout, FNP.,have documented all relevant documentation on the behalf of Gwyneth Sprout, FNP,as directed by  Gwyneth Sprout, FNP while in the presence of Gwyneth Sprout, FNP.   Chief Complaint  Patient presents with   Anxiety   Subjective    HPI  Anxiety, Follow-up  She was last seen for anxiety 6 months ago. Changes made at last visit include no changes.   She reports excellent compliance with treatment. She reports excellent tolerance of treatment. She is not having side effects.   She feels her anxiety is moderate and Unchanged since last visit.  Symptoms: No chest pain No difficulty concentrating  No dizziness No fatigue  No feelings of losing control No insomnia  No irritable No palpitations  Yes panic attacks No racing thoughts  No shortness of breath No sweating  No tremors/shakes    GAD-7 Results    05/14/2022    1:12 PM  GAD-7 Generalized Anxiety Disorder Screening Tool  1. Feeling Nervous, Anxious, or on Edge 0  2. Not Being Able to Stop or Control Worrying 0  3. Worrying Too Much About Different Things 0  4. Trouble Relaxing 0  5. Being So Restless it's Hard To Sit Still 0  6. Becoming Easily Annoyed or Irritable 0  7. Feeling Afraid As If Something Awful Might Happen 0  Total GAD-7 Score 0  Difficulty At Work, Home, or Getting  Along With Others? Not difficult at all    PHQ-9 Scores    05/14/2022    1:11 PM 10/26/2021   10:42 AM  PHQ9 SCORE ONLY  PHQ-9 Total Score 0 2    ---------------------------------------------------------------------------------------------------   Medications: Outpatient Medications Prior to Visit  Medication Sig   levonorgestrel (MIRENA) 20 MCG/24HR IUD 1 each by Intrauterine route once.    Prenatal Vit-Fe Fumarate-FA (MULTIVITAMIN-PRENATAL) 27-0.8 MG TABS tablet Take 1 tablet by mouth daily at 12 noon.   [DISCONTINUED] ondansetron (ZOFRAN-ODT) 4 MG disintegrating tablet Take 1 tablet (4 mg total) by mouth every 8 (eight) hours as needed for nausea or vomiting.   [DISCONTINUED] sertraline (ZOLOFT) 50 MG tablet Take 1 tablet (50 mg total) by mouth daily.   No facility-administered medications prior to visit.    Review of Systems    Objective    BP 107/75 (BP Location: Right Arm, Patient Position: Sitting, Cuff Size: Normal)   Pulse 84   Resp 16   Ht '5\' 4"'$  (1.626 m)   Wt 137 lb (62.1 kg)   SpO2 99%   BMI 23.52 kg/m   Physical Exam Vitals and nursing note reviewed.  Constitutional:      General: She is not in acute distress.    Appearance: Normal appearance. She is normal weight. She is not ill-appearing, toxic-appearing or diaphoretic.  HENT:     Head: Normocephalic and atraumatic.  Cardiovascular:     Rate and Rhythm: Normal rate and regular rhythm.     Pulses: Normal pulses.     Heart sounds: Normal heart sounds. No murmur heard.    No friction rub. No gallop.  Pulmonary:     Effort: Pulmonary effort is normal. No respiratory distress.     Breath sounds: Normal breath sounds. No stridor. No wheezing,  rhonchi or rales.  Chest:     Chest wall: No tenderness.  Musculoskeletal:        General: No swelling, tenderness, deformity or signs of injury. Normal range of motion.     Right lower leg: No edema.     Left lower leg: No edema.  Skin:    General: Skin is warm and dry.     Capillary Refill: Capillary refill takes less than 2 seconds.     Coloration: Skin is not jaundiced or pale.     Findings: No bruising, erythema, lesion or rash.  Neurological:     General: No focal deficit present.     Mental Status: She is alert and oriented to person, place, and time. Mental status is at baseline.     Cranial Nerves: No cranial nerve deficit.     Sensory: No  sensory deficit.     Motor: No weakness.     Coordination: Coordination normal.  Psychiatric:        Mood and Affect: Mood normal.        Behavior: Behavior normal.        Thought Content: Thought content normal.        Judgment: Judgment normal.     No results found for any visits on 05/14/22.  Assessment & Plan     Problem List Items Addressed This Visit       Other   Anxiety    Chronic, stable Continue Zoloft 50 mg Denies frequent panic- notes one attack with nausea and elevated HR; no further issues, occurred at night and may have been related to new smell in air freshener       Relevant Medications   sertraline (ZOLOFT) 50 MG tablet   Need for influenza vaccination - Primary   Relevant Orders   Flu Vaccine QUAD 6+ mos PF IM (Fluarix Quad PF)     Return in about 6 months (around 11/13/2022) for annual examination.      Vonna Kotyk, FNP, have reviewed all documentation for this visit. The documentation on 05/14/22 for the exam, diagnosis, procedures, and orders are all accurate and complete.    Gwyneth Sprout, [place] [phone] (phone) [phone] (fax)  [place]

## 2022-05-14 NOTE — Assessment & Plan Note (Signed)
Chronic, stable Continue Zoloft 50 mg Denies frequent panic- notes one attack with nausea and elevated HR; no further issues, occurred at night and may have been related to new smell in air freshener

## 2022-07-21 ENCOUNTER — Ambulatory Visit (INDEPENDENT_AMBULATORY_CARE_PROVIDER_SITE_OTHER): Payer: BC Managed Care – PPO | Admitting: Family Medicine

## 2022-07-21 ENCOUNTER — Other Ambulatory Visit: Payer: Self-pay | Admitting: Family Medicine

## 2022-07-21 ENCOUNTER — Encounter: Payer: Self-pay | Admitting: Family Medicine

## 2022-07-21 VITALS — BP 110/70 | HR 113 | Temp 98.6°F | Wt 139.8 lb

## 2022-07-21 DIAGNOSIS — R058 Other specified cough: Secondary | ICD-10-CM

## 2022-07-21 DIAGNOSIS — R0981 Nasal congestion: Secondary | ICD-10-CM | POA: Diagnosis not present

## 2022-07-21 LAB — POCT INFLUENZA A/B
Influenza A, POC: NEGATIVE
Influenza B, POC: NEGATIVE

## 2022-07-21 LAB — POC COVID19 BINAXNOW: SARS Coronavirus 2 Ag: NEGATIVE

## 2022-07-21 MED ORDER — BENZONATATE 200 MG PO CAPS: 200.0000 mg | ORAL_CAPSULE | Freq: Two times a day (BID) | ORAL | 0 refills | Status: DC | PRN

## 2022-07-21 MED ORDER — PREDNISONE 50 MG PO TABS: 50.0000 mg | ORAL_TABLET | Freq: Every day | ORAL | 0 refills | Status: DC

## 2022-07-21 MED ORDER — PROAIR RESPICLICK 108 (90 BASE) MCG/ACT IN AEPB: 2.0000 | INHALATION_SPRAY | Freq: Four times a day (QID) | RESPIRATORY_TRACT | 0 refills | Status: DC | PRN

## 2022-07-21 NOTE — Assessment & Plan Note (Signed)
Acute, stable Known exposure to flu; was vaccinated Negative COVID and FLU testing in office Continue supportive care Additional medications to assist, prednisone, inhaler, tessalon RTC if needed

## 2022-07-21 NOTE — Progress Notes (Signed)
I,Connie R Striblin,acting as a Education administrator for Gwyneth Sprout, FNP.,have documented all relevant documentation on the behalf of Gwyneth Sprout, FNP,as directed by  Gwyneth Sprout, FNP while in the presence of Gwyneth Sprout, FNP.  Established patient visit  Patient: Maria [name]   DOB: 01-07-1991   31 y.o. Female  MRN: [id number] Visit Date: 07/21/2022  Today's healthcare provider: Gwyneth Sprout, FNP  Re Introduced to nurse practitioner role and practice setting.  All questions answered.  Discussed provider/patient relationship and expectations.  Chief Complaint  Patient presents with   Cough        Subjective    HPI HPI     Cough    Additional comments:        Last edited by Araceli Bouche, CMA on 07/21/2022  8:18 AM.      Cough: Patient complains of cough. Symptoms began 6 days ago. Cough described as productive of clear sputum. Patient denies chills and fever. Associated symptoms include nasal congestion, sneezing, and sore throat..   Current treatments have included  OTC cough medication  , with good improvement.    Pt had exposure to Flu  Medications: Outpatient Medications Prior to Visit  Medication Sig   levonorgestrel (MIRENA) 20 MCG/24HR IUD 1 each by Intrauterine route once.   Prenatal Vit-Fe Fumarate-FA (MULTIVITAMIN-PRENATAL) 27-0.8 MG TABS tablet Take 1 tablet by mouth daily at 12 noon.   sertraline (ZOLOFT) 50 MG tablet Take 1 tablet (50 mg total) by mouth daily.   No facility-administered medications prior to visit.   Review of Systems    Objective    BP 110/70   Pulse (!) 113   Temp 98.6 F (37 C) (Oral)   Wt 139 lb 12.8 oz (63.4 kg)   SpO2 97%   BMI 24.00 kg/m   Physical Exam Vitals and nursing note reviewed.  Constitutional:      General: She is not in acute distress.    Appearance: Normal appearance. She is normal weight. She is not ill-appearing, toxic-appearing or diaphoretic.  HENT:     Head: Normocephalic and atraumatic.      Right Ear: Tympanic membrane, ear canal and external ear normal.     Left Ear: Tympanic membrane, ear canal and external ear normal.     Nose: Congestion present.     Mouth/Throat:     Mouth: Mucous membranes are moist.     Pharynx: Oropharynx is clear. No oropharyngeal exudate or posterior oropharyngeal erythema.  Eyes:     Pupils: Pupils are equal, round, and reactive to light.  Cardiovascular:     Rate and Rhythm: Regular rhythm. Tachycardia present.     Pulses: Normal pulses.     Heart sounds: Normal heart sounds. No murmur heard.    No friction rub. No gallop.  Pulmonary:     Effort: Pulmonary effort is normal. No respiratory distress.     Breath sounds: Normal breath sounds. No stridor. No wheezing, rhonchi or rales.  Chest:     Chest wall: No tenderness.  Musculoskeletal:        General: No swelling, tenderness, deformity or signs of injury. Normal range of motion.     Right lower leg: No edema.     Left lower leg: No edema.  Skin:    General: Skin is warm and dry.     Capillary Refill: Capillary refill takes less than 2 seconds.     Coloration: Skin is not jaundiced or pale.  Findings: No bruising, erythema, lesion or rash.  Neurological:     General: No focal deficit present.     Mental Status: She is alert and oriented to person, place, and time. Mental status is at baseline.     Cranial Nerves: No cranial nerve deficit.     Sensory: No sensory deficit.     Motor: No weakness.     Coordination: Coordination normal.  Psychiatric:        Mood and Affect: Mood normal.        Behavior: Behavior normal.        Thought Content: Thought content normal.        Judgment: Judgment normal.    Results for orders placed or performed in visit on 07/21/22  POCT Influenza A/B  Result Value Ref Range   Influenza A, POC Negative Negative   Influenza B, POC Negative Negative  POC COVID-19  Result Value Ref Range   SARS Coronavirus 2 Ag Negative Negative    Assessment &  Plan     Problem List Items Addressed This Visit       Respiratory   Cough with congestion of paranasal sinus - Primary    Acute, stable Known exposure to flu; was vaccinated Negative COVID and FLU testing in office Continue supportive care Additional medications to assist, prednisone, inhaler, tessalon RTC if needed       Relevant Orders   POCT Influenza A/B (Completed)   POC COVID-19 (Completed)   Return if symptoms worsen or fail to improve.     Vonna Kotyk, FNP, have reviewed all documentation for this visit. The documentation on 07/21/22 for the exam, diagnosis, procedures, and orders are all accurate and complete.  Gwyneth Sprout, [place] [phone] (phone) [phone] (fax)  [place]

## 2022-09-16 ENCOUNTER — Ambulatory Visit: Payer: BC Managed Care – PPO | Admitting: Family Medicine

## 2022-09-16 ENCOUNTER — Encounter: Payer: Self-pay | Admitting: Family Medicine

## 2022-09-16 VITALS — BP 107/75 | HR 112 | Temp 99.2°F | Wt 136.8 lb

## 2022-09-16 DIAGNOSIS — K529 Noninfective gastroenteritis and colitis, unspecified: Secondary | ICD-10-CM

## 2022-09-16 DIAGNOSIS — H9202 Otalgia, left ear: Secondary | ICD-10-CM

## 2022-09-16 DIAGNOSIS — H66002 Acute suppurative otitis media without spontaneous rupture of ear drum, left ear: Secondary | ICD-10-CM

## 2022-09-16 LAB — POCT INFLUENZA A/B
Influenza A, POC: NEGATIVE
Influenza B, POC: NEGATIVE

## 2022-09-16 MED ORDER — AMOXICILLIN-POT CLAVULANATE 875-125 MG PO TABS
1.0000 | ORAL_TABLET | Freq: Two times a day (BID) | ORAL | 0 refills | Status: DC
Start: 2022-09-16 — End: 2022-11-09

## 2022-09-16 MED ORDER — ONDANSETRON HCL 4 MG PO TABS: 4.0000 mg | ORAL_TABLET | Freq: Three times a day (TID) | ORAL | 0 refills | Status: DC | PRN

## 2022-09-16 NOTE — Assessment & Plan Note (Signed)
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

## 2022-09-16 NOTE — Assessment & Plan Note (Signed)
Acute, self limiting Reports muffled hearing and pain ABX provided RTC as needed

## 2022-09-16 NOTE — Progress Notes (Signed)
I,Connie R Striblin,acting as a Education administrator for Gwyneth Sprout, FNP.,have documented all relevant documentation on the behalf of Gwyneth Sprout, FNP,as directed by  Gwyneth Sprout, FNP while in the presence of Gwyneth Sprout, FNP.   Established patient visit   Patient: Maria [name]   DOB: 28-Sep-1990   32 y.o. Female  MRN: [id number] Visit Date: 09/16/2022  Today's healthcare provider: Gwyneth Sprout, FNP  Re Introduced to nurse practitioner role and practice setting.  All questions answered.  Discussed provider/patient relationship and expectations.  Chief Complaint  Patient presents with   Ear Pain   Subjective    HPI  Ear Pain: Patient complains of ear pain. Symptoms began 1 day ago. Pain is of moderate severity. Fever is believed to be present, temp not taken. Other associated symptoms have included abdominal pain, chills, nausea, sleepiness, vomiting.  Fluid intake is good. Current medications include ibuprofen.    Symptoms started Tuesday night- nausea and vomiting, which have almost completley resolved   Ear pain started This morning.   Medications: Outpatient Medications Prior to Visit  Medication Sig   Albuterol Sulfate (PROAIR RESPICLICK) 123XX123 (90 Base) MCG/ACT AEPB Inhale 2 puffs into the lungs every 6 (six) hours as needed.   benzonatate (TESSALON) 200 MG capsule Take 1 capsule (200 mg total) by mouth 2 (two) times daily as needed for cough.   levonorgestrel (MIRENA) 20 MCG/24HR IUD 1 each by Intrauterine route once.   predniSONE (DELTASONE) 50 MG tablet Take 1 tablet (50 mg total) by mouth daily with breakfast.   Prenatal Vit-Fe Fumarate-FA (MULTIVITAMIN-PRENATAL) 27-0.8 MG TABS tablet Take 1 tablet by mouth daily at 12 noon.   sertraline (ZOLOFT) 50 MG tablet Take 1 tablet (50 mg total) by mouth daily.   No facility-administered medications prior to visit.    Review of Systems     Objective    BP 107/75 (BP Location: Right Arm, Patient Position: Sitting, Cuff  Size: Normal)   Pulse (!) 112   Temp 99.2 F (37.3 C) (Oral)   Wt 136 lb 12.8 oz (62.1 kg)   SpO2 98%   BMI 23.48 kg/m    Physical Exam Vitals and nursing note reviewed.  Constitutional:      General: She is not in acute distress.    Appearance: Normal appearance. She is normal weight. She is not ill-appearing, toxic-appearing or diaphoretic.  HENT:     Head: Normocephalic and atraumatic.     Right Ear: Tympanic membrane, ear canal and external ear normal.     Left Ear: Ear canal and external ear normal. Swelling and tenderness present. Tympanic membrane is bulging.     Nose: Nose normal.     Mouth/Throat:     Mouth: Mucous membranes are moist.     Pharynx: Oropharynx is clear.  Eyes:     Extraocular Movements: Extraocular movements intact.     Conjunctiva/sclera: Conjunctivae normal.     Pupils: Pupils are equal, round, and reactive to light.  Cardiovascular:     Rate and Rhythm: Normal rate and regular rhythm.     Pulses: Normal pulses.     Heart sounds: Normal heart sounds. No murmur heard.    No friction rub. No gallop.  Pulmonary:     Effort: Pulmonary effort is normal. No respiratory distress.     Breath sounds: Normal breath sounds. No stridor. No wheezing, rhonchi or rales.  Chest:     Chest wall: No tenderness.  Abdominal:  General: Bowel sounds are normal.     Palpations: Abdomen is soft.  Musculoskeletal:        General: No swelling, tenderness, deformity or signs of injury. Normal range of motion.     Right lower leg: No edema.     Left lower leg: No edema.  Skin:    General: Skin is warm and dry.     Capillary Refill: Capillary refill takes less than 2 seconds.     Coloration: Skin is not jaundiced or pale.     Findings: No bruising, erythema, lesion or rash.  Neurological:     General: No focal deficit present.     Mental Status: She is alert and oriented to person, place, and time. Mental status is at baseline.     Cranial Nerves: No cranial nerve  deficit.     Sensory: No sensory deficit.     Motor: No weakness.     Coordination: Coordination normal.  Psychiatric:        Mood and Affect: Mood normal.        Behavior: Behavior normal.        Thought Content: Thought content normal.        Judgment: Judgment normal.     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  Recommend stool sample if warranted Continue bland foods PRN zofran provided      Relevant Orders   POCT Influenza A/B (Completed)     Nervous and Auditory   Non-recurrent acute suppurative otitis media of left ear without spontaneous rupture of tympanic membrane - Primary    Acute, self limiting Reports muffled hearing and pain ABX provided RTC as needed      Relevant Medications   amoxicillin-clavulanate (AUGMENTIN) 875-125 MG tablet     Other   Left ear pain    POC Flu negative; denies throat symptoms Encourage OTC Covid test if known covid exposure; pt notes most kids are sick with GI bugs.      Relevant Orders   POCT Influenza A/B (Completed)   Return if symptoms worsen or fail to improve.     Vonna Kotyk, FNP, have reviewed all documentation for this visit. The documentation on 09/16/22 for the exam, diagnosis, procedures, and orders are all accurate and complete.  Gwyneth Sprout, [place] [phone] (phone) [phone] (fax)  [place]

## 2022-09-16 NOTE — Assessment & Plan Note (Signed)
POC Flu negative; denies throat symptoms Encourage OTC Covid test if known covid exposure; pt notes most kids are sick with GI bugs.

## 2022-11-08 NOTE — Progress Notes (Unsigned)
I,J'ya E Liba Hulsey,acting as a scribe for Jacky Kindle, FNP.,have documented all relevant documentation on the behalf of Jacky Kindle, FNP,as directed by  Jacky Kindle, FNP while in the presence of Jacky Kindle, FNP.  Complete physical exam  Patient: Maria [name]   DOB: Jul 13, 1991   32 y.o. Female  MRN: [id number] Visit Date: 11/09/2022  Today's healthcare provider: Jacky Kindle, FNP  Introduced to nurse practitioner role and practice setting.  All questions answered.  Discussed provider/patient relationship and expectations.  Chief Complaint  Patient presents with   Annual Exam   Subjective    Maria [name] is a 32 y.o. female who presents today for a complete physical exam.  She reports consuming a general diet. Gym/ health club routine includes cardio and light weights. She generally feels well. She reports sleeping well. She does not have additional problems to discuss today.  HPI   Past Medical History:  Diagnosis Date   ADHD (attention deficit hyperactivity disorder)    Anxiety    Past Surgical History:  Procedure Laterality Date   LAPAROSCOPIC OVARIAN CYSTECTOMY Left 03/01/2016   Procedure: LAPAROSCOPIC OVARIAN CYSTECTOMY;  Surgeon: Christeen Douglas, MD;  Location: [facility] ORS;  Service: Gynecology;  Laterality: Left;   LAPAROSCOPY N/A 03/01/2016   Procedure: LAPAROSCOPY DIAGNOSTIC;  Surgeon: Christeen Douglas, MD;  Location: [facility] OR[facility];  Service: Gynecology;  Laterality: N/A;   Mirena insertion  09/28/2019   WISDOM TOOTH EXTRACTION     WISDOM TOOTH EXTRACTION     Social History   Socioeconomic History   Marital status: Married    Spouse name: Not on file   Number of children: Not on file   Years of education: Not on file   Highest education level: Not on file  Occupational History   Not on file  Tobacco Use   Smoking status: Never   Smokeless tobacco: Never  Vaping Use   Vaping Use: Never used  Substance and Sexual Activity   Alcohol use: Yes    Alcohol/week:  2.0 standard drinks of alcohol    Types: 1 Glasses of wine, 1 Cans of beer per week    Comment: occasionally   Drug use: No   Sexual activity: Yes    Birth control/protection: I.U.D.    Comment: Mirena  Other Topics Concern   Not on file  Social History Narrative   Not on file   Social Determinants of Health   Financial Resource Strain: Not on file  Food Insecurity: Not on file  Transportation Needs: Not on file  Physical Activity: Not on file  Stress: Not on file  Social Connections: Not on file  Intimate Partner Violence: Not on file   Family Status  Relation Name Status   Mother  Alive   Father  Alive   Family History  Problem Relation Age of Onset   Healthy Mother    Healthy Father    No Known Allergies  Patient Care Team: Jacky Kindle, FNP as PCP - General (Family Medicine)   Medications: Outpatient Medications Prior to Visit  Medication Sig   levonorgestrel (MIRENA) 20 MCG/24HR IUD 1 each by Intrauterine route once.   Prenatal Vit-Fe Fumarate-FA (MULTIVITAMIN-PRENATAL) 27-0.8 MG TABS tablet Take 1 tablet by mouth daily at 12 noon.   sertraline (ZOLOFT) 50 MG tablet Take 1 tablet (50 mg total) by mouth daily.   [DISCONTINUED] Albuterol Sulfate (PROAIR RESPICLICK) 108 (90 Base) MCG/ACT AEPB Inhale 2 puffs into the lungs every 6 (  six) hours as needed.   [DISCONTINUED] amoxicillin-clavulanate (AUGMENTIN) 875-125 MG tablet Take 1 tablet by mouth 2 (two) times daily.   [DISCONTINUED] benzonatate (TESSALON) 200 MG capsule Take 1 capsule (200 mg total) by mouth 2 (two) times daily as needed for cough.   [DISCONTINUED] ondansetron (ZOFRAN) 4 MG tablet Take 1 tablet (4 mg total) by mouth every 8 (eight) hours as needed for nausea or vomiting.   [DISCONTINUED] predniSONE (DELTASONE) 50 MG tablet Take 1 tablet (50 mg total) by mouth daily with breakfast.   No facility-administered medications prior to visit.    Review of Systems   Objective    BP 108/67 (BP  Location: Right Arm, Patient Position: Sitting, Cuff Size: Normal)   Pulse 82   Temp 98.8 F (37.1 C) (Oral)   Ht  (1.626 m)   Wt 136 lb 9.6 oz (62 kg)   SpO2 98%   Breastfeeding Yes   BMI 23.45 kg/m   Physical Exam   Last depression screening scores    11/09/2022    1:07 PM 07/21/2022    8:29 AM 05/14/2022    1:11 PM  PHQ 2/9 Scores  PHQ - 2 Score 0 0 0  PHQ- 9 Score 0 0 0   Last fall risk screening    11/09/2022    1:06 PM  Fall Risk   Falls in the past year? 0  Number falls in past yr: 0  Injury with Fall? 0  Risk for fall due to : No Fall Risks   Last Audit-C alcohol use screening    11/09/2022    1:08 PM  Alcohol Use Disorder Test (AUDIT)  1. How often do you have a drink containing alcohol? 1  2. How many drinks containing alcohol do you have on a typical day when you are drinking? 0  3. How often do you have six or more drinks on one occasion? 0  AUDIT-C Score 1   A score of 3 or more in women, and 4 or more in men indicates increased risk for alcohol abuse, EXCEPT if all of the points are from question 1   No results found for any visits on 11/09/22.  Assessment & Plan    Routine Health Maintenance and Physical Exam  Exercise Activities and Dietary recommendations  Goals   None     Immunization History  Administered Date(s) Administered   Influenza,inj,Quad PF,6+ Mos 05/11/2018, 04/11/2019, 05/15/2021, 05/14/2022   Influenza-Unspecified 04/06/2019   Moderna Sars-Covid-2 Vaccination 09/22/2019, 10/20/2019   PPD Test 09/24/2014, 12/23/2015, 02/15/2016   Tdap 10/13/2017   Varicella 08/15/2019    Health Maintenance  Topic Date Due   Hepatitis C Screening  Never done   PAP-Cervical Cytology Screening  08/17/2021   PAP SMEAR-Modifier  08/17/2021   COVID-19 Vaccine (3 - 2023-24 season) 03/26/2022   INFLUENZA VACCINE  02/24/2023   DTaP/Tdap/Td (2 - Td or Tdap) 10/14/2027   HIV Screening  Completed   HPV VACCINES  Aged Out    Discussed  health benefits of physical activity, and encouraged her to engage in regular exercise appropriate for her age and condition.  Problem List Items Addressed This Visit       Other   Annual physical exam - Primary    UTD on vision and dental Discussed breast self exam Things to do to keep yourself healthy  - Exercise at least 30-45 minutes a day, 3-4 days a week.  - Eat a low-fat diet with lots of fruits and  vegetables, up to 7-9 servings per day.  - Seatbelts can save your life. Wear them always.  - Smoke detectors on every level of your home, check batteries every year.  - Eye Doctor - have an eye exam every 1-2 years  - Safe sex - if you may be exposed to STDs, use a condom.  - Alcohol -  If you drink, do it moderately, less than 2 drinks per day.  - Health Care Power of Attorney. Choose someone to speak for you if you are not able.  - Depression is common in our stressful world.If you're feeling down or losing interest in things you normally enjoy, please come in for a visit.  - Violence - If anyone is threatening or hurting you, please call immediately.       Relevant Orders   CBC with Differential/Platelet   Comprehensive Metabolic Panel (CMET)   Encounter for hepatitis C screening test for low risk patient    Low risk screen Treatable, and curable. If left untreated Hep C can lead to cirrhosis and liver failure. Encourage routine testing; recommend repeat testing if risk factors change.       Relevant Orders   Hepatitis C Antibody   Screening for cervical cancer    Due for PAP; negative HPV history. IUD in place       Relevant Orders   Cytology - PAP   Return in about 1 year (around 11/09/2023) for annual examination.   Leilani Merl, FNP, have reviewed all documentation for this visit. The documentation on 11/09/22 for the exam, diagnosis, procedures, and orders are all accurate and complete.  Jacky Kindle, FNP  [facility] [phone] (phone) [phone] (fax)  [facility]

## 2022-11-09 ENCOUNTER — Encounter: Payer: Self-pay | Admitting: Family Medicine

## 2022-11-09 ENCOUNTER — Other Ambulatory Visit (HOSPITAL_COMMUNITY)
Admission: RE | Admit: 2022-11-09 | Discharge: 2022-11-09 | Disposition: A | Discharge status: Home / Self Care | Payer: BC Managed Care – PPO | Source: Ambulatory Visit | Attending: Family Medicine | Admitting: Family Medicine

## 2022-11-09 ENCOUNTER — Ambulatory Visit (INDEPENDENT_AMBULATORY_CARE_PROVIDER_SITE_OTHER): Payer: BC Managed Care – PPO | Admitting: Family Medicine

## 2022-11-09 VITALS — BP 108/67 | HR 82 | Temp 98.8°F | Ht 64.0 in | Wt 136.6 lb

## 2022-11-09 DIAGNOSIS — Z0000 Encounter for general adult medical examination without abnormal findings: Secondary | ICD-10-CM

## 2022-11-09 DIAGNOSIS — Z124 Encounter for screening for malignant neoplasm of cervix: Secondary | ICD-10-CM | POA: Insufficient documentation

## 2022-11-09 DIAGNOSIS — Z309 Encounter for contraceptive management, unspecified: Secondary | ICD-10-CM | POA: Insufficient documentation

## 2022-11-09 DIAGNOSIS — Z1159 Encounter for screening for other viral diseases: Secondary | ICD-10-CM | POA: Diagnosis not present

## 2022-11-09 NOTE — Assessment & Plan Note (Signed)
Due for PAP; negative HPV history. IUD in place

## 2022-11-09 NOTE — Assessment & Plan Note (Signed)
UTD on vision and dental Discussed breast self exam Things to do to keep yourself healthy  - Exercise at least 30-45 minutes a day, 3-4 days a week.  - Eat a low-fat diet with lots of fruits and vegetables, up to 7-9 servings per day.  - Seatbelts can save your life. Wear them always.  - Smoke detectors on every level of your home, check batteries every year.  - Eye Doctor - have an eye exam every 1-2 years  - Safe sex - if you may be exposed to STDs, use a condom.  - Alcohol -  If you drink, do it moderately, less than 2 drinks per day.  - Health Care Power of Attorney. Choose someone to speak for you if you are not able.  - Depression is common in our stressful world.If you're feeling down or losing interest in things you normally enjoy, please come in for a visit.  - Violence - If anyone is threatening or hurting you, please call immediately.

## 2022-11-09 NOTE — Assessment & Plan Note (Signed)
Low risk screen Treatable, and curable. If left untreated Hep C can lead to cirrhosis and liver failure. Encourage routine testing; recommend repeat testing if risk factors change.  

## 2022-11-10 LAB — CBC WITH DIFFERENTIAL/PLATELET
Basophils Absolute: 0 10*3/uL (ref 0.0–0.2)
Basos: 0 %
EOS (ABSOLUTE): 0.1 10*3/uL (ref 0.0–0.4)
Eos: 1 %
Hematocrit: 38 % (ref 34.0–46.6)
Hemoglobin: 12.6 g/dL (ref 11.1–15.9)
Immature Grans (Abs): 0 10*3/uL (ref 0.0–0.1)
Immature Granulocytes: 0 %
Lymphocytes Absolute: 2.5 10*3/uL (ref 0.7–3.1)
Lymphs: 28 %
MCH: 29.7 pg (ref 26.6–33.0)
MCHC: 33.2 g/dL (ref 31.5–35.7)
MCV: 90 fL (ref 79–97)
Monocytes Absolute: 0.5 10*3/uL (ref 0.1–0.9)
Monocytes: 5 %
Neutrophils Absolute: 5.8 10*3/uL (ref 1.4–7.0)
Neutrophils: 66 %
Platelets: 233 10*3/uL (ref 150–450)
RBC: 4.24 x10E6/uL (ref 3.77–5.28)
RDW: 12.4 % (ref 11.7–15.4)
WBC: 8.9 10*3/uL (ref 3.4–10.8)

## 2022-11-10 LAB — COMPREHENSIVE METABOLIC PANEL
ALT: 24 IU/L (ref 0–32)
AST: 36 IU/L (ref 0–40)
Albumin/Globulin Ratio: 2 (ref 1.2–2.2)
Albumin: 4.5 g/dL (ref 4.0–5.0)
Alkaline Phosphatase: 82 IU/L (ref 44–121)
BUN/Creatinine Ratio: 19 (ref 9–23)
BUN: 11 mg/dL (ref 6–20)
Bilirubin Total: 0.2 mg/dL (ref 0.0–1.2)
CO2: 21 mmol/L (ref 20–29)
Calcium: 8.9 mg/dL (ref 8.7–10.2)
Chloride: 102 mmol/L (ref 96–106)
Creatinine, Ser: 0.59 mg/dL (ref 0.57–1.00)
Globulin, Total: 2.2 g/dL (ref 1.5–4.5)
Glucose: 83 mg/dL (ref 70–99)
Potassium: 4 mmol/L (ref 3.5–5.2)
Sodium: 139 mmol/L (ref 134–144)
Total Protein: 6.7 g/dL (ref 6.0–8.5)
eGFR: 125 mL/min/{1.73_m2} (ref >59)

## 2022-11-10 LAB — HEPATITIS C ANTIBODY: Hep C Virus Ab: NONREACTIVE

## 2022-11-10 NOTE — Progress Notes (Signed)
Labs are normal and stable.  

## 2022-11-15 LAB — CYTOLOGY - PAP
Chlamydia: NEGATIVE
Comment: NEGATIVE
Comment: NEGATIVE
Comment: NEGATIVE
Comment: NEGATIVE
Comment: NORMAL
Diagnosis: NEGATIVE
HSV1: NEGATIVE
HSV2: NEGATIVE
High risk HPV: NEGATIVE
Neisseria Gonorrhea: NEGATIVE
Trichomonas: NEGATIVE

## 2022-11-15 NOTE — Progress Notes (Signed)
Normal, negative PAP. Repeat in 3 years.

## 2023-05-04 ENCOUNTER — Other Ambulatory Visit: Payer: Self-pay | Admitting: Family Medicine

## 2023-05-04 DIAGNOSIS — F419 Anxiety disorder, unspecified: Secondary | ICD-10-CM

## 2023-05-04 MED ORDER — SERTRALINE HCL 50 MG PO TABS: 50.0000 mg | ORAL_TABLET | Freq: Every day | ORAL | 0 refills | Status: DC

## 2023-05-04 NOTE — Telephone Encounter (Signed)
Requested Prescriptions  Pending Prescriptions Disp Refills   sertraline (ZOLOFT) 50 MG tablet 90 tablet 0    Sig: Take 1 tablet (50 mg total) by mouth daily.     Psychiatry:  Antidepressants - SSRI - sertraline Passed - 05/04/2023 11:16 AM      Passed - AST in normal range and within 360 days    AST  Date Value Ref Range Status  11/09/2022 36 0 - 40 IU/L Final         Passed - ALT in normal range and within 360 days    ALT  Date Value Ref Range Status  11/09/2022 24 0 - 32 IU/L Final         Passed - Completed PHQ-2 or PHQ-9 in the last 360 days      Passed - Valid encounter within last 6 months    Recent Outpatient Visits           5 months ago Annual physical exam   [facility] Merita Norton T, FNP   7 months ago Non-recurrent acute suppurative otitis media of left ear without spontaneous rupture of tympanic membrane   [facility] Merita Norton T, FNP   9 months ago Cough with congestion of paranasal sinus   [facility] Jacky Kindle, FNP   11 months ago Need for influenza vaccination   [facility] Jacky Kindle, FNP   1 year ago Hospital discharge follow-up   [facility] Jacky Kindle, [place]

## 2023-05-04 NOTE — Telephone Encounter (Signed)
Medication Refill - Medication: sertraline (ZOLOFT) 50 MG tablet   Has the patient contacted their pharmacy? No.  Preferred Pharmacy (with phone number or street name):  [id number] [address] Phone: [phone]  Fax: [phone]     Has the patient been seen for an appointment in the last year OR does the patient have an upcoming appointment? Yes.    Agent: Please be advised that RX refills may take up to 3 business days. We ask that you follow-up with your pharmacy.

## 2023-07-06 ENCOUNTER — Telehealth: Payer: Self-pay

## 2023-07-06 NOTE — Telephone Encounter (Unsigned)
Copied from CRM [id number]. Topic: General - Other >> Jul 06, 2023 12:57 PM Jannifer Rodney M wrote: Reason for CRM: Pt stated that she received a message to contact Okey Regal B regarding her insurance information from the previous year. Cb# [phone]

## 2023-07-07 NOTE — Telephone Encounter (Signed)
Left message on VM to call back and ask for [facility].  Not sure what we were to discuss as the message I sent her was in June of this year and she is just now replying to it.

## 2023-07-30 ENCOUNTER — Other Ambulatory Visit: Payer: Self-pay | Admitting: Family Medicine

## 2023-07-30 DIAGNOSIS — F419 Anxiety disorder, unspecified: Secondary | ICD-10-CM

## 2023-08-02 NOTE — Telephone Encounter (Signed)
 Requested Prescriptions  Pending Prescriptions Disp Refills   sertraline  (ZOLOFT ) 50 MG tablet [Pharmacy Med Name: SERTRALINE  HCL 50 MG TABLET] 90 tablet 0    Sig: TAKE 1 TABLET BY MOUTH EVERY DAY     Psychiatry:  Antidepressants - SSRI - sertraline  Failed - 08/02/2023  1:12 PM      Failed - Valid encounter within last 6 months    Recent Outpatient Visits           8 months ago Annual physical exam   [facility] Emilio Marseille T, FNP   10 months ago Non-recurrent acute suppurative otitis media of left ear without spontaneous rupture of tympanic membrane   [facility] Emilio Marseille T, FNP   1 year ago Cough with congestion of paranasal sinus   [facility] Emilio Marseille T, FNP   1 year ago Need for influenza vaccination   [facility] Emilio Marseille DASEN, FNP   1 year ago Hospital discharge follow-up   [facility] Emilio Marseille T, FNP       Future Appointments             In 1 week Wellington Curtis LABOR, FNP [facility]            Passed - AST in normal range and within 360 days    AST  Date Value Ref Range Status  11/09/2022 36 0 - 40 IU/L Final         Passed - ALT in normal range and within 360 days    ALT  Date Value Ref Range Status  11/09/2022 24 0 - 32 IU/L Final         Passed - Completed PHQ-2 or PHQ-9 in the last 360 days

## 2023-08-02 NOTE — Progress Notes (Signed)
 PCP:  Emilio Kelly DASEN, FNP   Chief Complaint  Patient presents with   Gynecologic Exam    No concerns     HPI:      [name] is a 33 y.o. G1P1001 whose LMP was Patient's last menstrual period was 07/24/2023 (approximate)., presents today for her annual examination.  Her menses are irregular with IUD, sometimes monthly, lasting 4-5 days, mod flow.  Dysmenorrhea mild, no BTB.   Sex activity: single partner, contraception - Mirena  placed 09/28/19 at [facility] visit. No bleeding; sometimes has some dryness with pain, lubricants can cause itching. Still breastfeeding occas.  Last Pap: 11/09/22 Results were: no abnormalities /neg HPV DNA   There is no FH of breast cancer. There is no FH of ovarian cancer. The patient does do self-breast exams.  Tobacco use: occas nicotine patches  Alcohol use: none No drug use.  Exercise: moderately active  She does get adequate calcium but not Vitamin D in her diet.  Patient Active Problem List   Diagnosis Date Noted   Annual physical exam 11/09/2022   Screening for cervical cancer 11/09/2022   Encounter for hepatitis C screening test for low risk patient 11/09/2022    Past Surgical History:  Procedure Laterality Date   LAPAROSCOPIC OVARIAN CYSTECTOMY Left 03/01/2016   Procedure: LAPAROSCOPIC OVARIAN CYSTECTOMY;  Surgeon: Heather Penton, MD;  Location: [facility] ORS;  Service: Gynecology;  Laterality: Left;   LAPAROSCOPY N/A 03/01/2016   Procedure: LAPAROSCOPY DIAGNOSTIC;  Surgeon: Heather Penton, MD;  Location: [facility] ORS;  Service: Gynecology;  Laterality: N/A;   Mirena  insertion  09/28/2019   WISDOM TOOTH EXTRACTION     WISDOM TOOTH EXTRACTION      Family History  Problem Relation Age of Onset   Healthy Mother    Healthy Father     Social History   Socioeconomic History   Marital status: Married    Spouse name: Not on file   Number of children: Not on file   Years of education: Not on file   Highest education level: Not on file   Occupational History   Not on file  Tobacco Use   Smoking status: Never   Smokeless tobacco: Current    Types: Chew  Vaping Use   Vaping status: Never Used  Substance and Sexual Activity   Alcohol use: Yes    Alcohol/week: 2.0 standard drinks of alcohol    Types: 1 Glasses of wine, 1 Cans of beer per week    Comment: occasionally   Drug use: No   Sexual activity: Yes    Birth control/protection: I.U.D.    Comment: Mirena   Other Topics Concern   Not on file  Social History Narrative   Not on file   Social Drivers of Health   Financial Resource Strain: Not on file  Food Insecurity: Not on file  Transportation Needs: Not on file  Physical Activity: Not on file  Stress: Not on file  Social Connections: Not on file  Intimate Partner Violence: Not on file     Current Outpatient Medications:    levonorgestrel  (MIRENA ) 20 MCG/24HR IUD, 1 each by Intrauterine route once., Disp: , Rfl:    sertraline  (ZOLOFT ) 50 MG tablet, TAKE 1 TABLET BY MOUTH EVERY DAY, Disp: 90 tablet, Rfl: 0     ROS:  Review of Systems  Constitutional:  Negative for fatigue, fever and unexpected weight change.  Respiratory:  Negative for cough, shortness of breath and wheezing.   Cardiovascular:  Negative for  chest pain, palpitations and leg swelling.  Gastrointestinal:  Negative for blood in stool, constipation, diarrhea, nausea and vomiting.  Endocrine: Negative for cold intolerance, heat intolerance and polyuria.  Genitourinary:  Positive for dyspareunia. Negative for dysuria, flank pain, frequency, genital sores, hematuria, menstrual problem, pelvic pain, urgency, vaginal bleeding, vaginal discharge and vaginal pain.  Musculoskeletal:  Negative for back pain, joint swelling and myalgias.  Skin:  Negative for rash.  Neurological:  Negative for dizziness, syncope, light-headedness, numbness and headaches.  Hematological:  Negative for adenopathy.  Psychiatric/Behavioral:  Negative for agitation,  confusion, sleep disturbance and suicidal ideas. The patient is not nervous/anxious.    BREAST: No symptoms   Objective: BP 104/62   Pulse 87   Ht 5' 4 (1.626 m)   Wt 136 lb (61.7 kg)   LMP 07/24/2023 (Approximate)   Breastfeeding Yes   BMI 23.34 kg/m    Physical Exam Constitutional:      Appearance: She is well-developed.  Genitourinary:     Vulva normal.     Right Labia: No rash, tenderness or lesions.    Left Labia: No tenderness, lesions or rash.    No vaginal discharge, erythema or tenderness.      Right Adnexa: not tender and no mass present.    Left Adnexa: not tender and no mass present.    No cervical friability or polyp.     IUD strings visualized.     Uterus is not enlarged or tender.  Breasts:    Right: No mass, nipple discharge, skin change or tenderness.     Left: No mass, nipple discharge, skin change or tenderness.  Neck:     Thyroid : No thyromegaly.  Cardiovascular:     Rate and Rhythm: Normal rate and regular rhythm.     Heart sounds: Normal heart sounds. No murmur heard. Pulmonary:     Effort: Pulmonary effort is normal.     Breath sounds: Normal breath sounds.  Abdominal:     Palpations: Abdomen is soft.     Tenderness: There is no abdominal tenderness. There is no guarding or rebound.  Musculoskeletal:        General: Normal range of motion.     Cervical back: Normal range of motion.  Lymphadenopathy:     Cervical: No cervical adenopathy.  Neurological:     General: No focal deficit present.     Mental Status: She is alert and oriented to person, place, and time.     Cranial Nerves: No cranial nerve deficit.  Skin:    General: Skin is warm and dry.  Psychiatric:        Mood and Affect: Mood normal.        Behavior: Behavior normal.        Thought Content: Thought content normal.        Judgment: Judgment normal.  Vitals reviewed.    Assessment/Plan: Encounter for annual routine gynecological examination  Encounter for routine  checking of intrauterine contraceptive device (IUD); IUD strings in cx os, has 8 yr indication  Vaginal dryness--decreased lubrication during sex, still BF occas; increase hydration/try coconut oil. F/u prn.             GYN counsel adequate intake of calcium and vitamin D, diet and exercise     F/U  Return in about 1 year (around 08/03/2024).  Letti Towell B. Feliberto Stockley, PA-C 08/04/2023 1:35 PM

## 2023-08-04 ENCOUNTER — Encounter: Payer: Self-pay | Admitting: Obstetrics and Gynecology

## 2023-08-04 ENCOUNTER — Ambulatory Visit (INDEPENDENT_AMBULATORY_CARE_PROVIDER_SITE_OTHER): Payer: BC Managed Care – PPO | Admitting: Obstetrics and Gynecology

## 2023-08-04 VITALS — BP 104/62 | HR 87 | Ht 64.0 in | Wt 136.0 lb

## 2023-08-04 DIAGNOSIS — Z01419 Encounter for gynecological examination (general) (routine) without abnormal findings: Secondary | ICD-10-CM | POA: Diagnosis not present

## 2023-08-04 DIAGNOSIS — N898 Other specified noninflammatory disorders of vagina: Secondary | ICD-10-CM

## 2023-08-04 DIAGNOSIS — Z30431 Encounter for routine checking of intrauterine contraceptive device: Secondary | ICD-10-CM

## 2023-08-04 NOTE — Patient Instructions (Signed)
 I value your feedback and you entrusting [place] with your care. If you get a [place] patient survey, I would appreciate you taking the time to let us know about your experience today. Thank you! ? ? ?

## 2023-08-15 ENCOUNTER — Encounter: Payer: Self-pay | Admitting: Family Medicine

## 2023-08-15 ENCOUNTER — Ambulatory Visit: Payer: BC Managed Care – PPO | Admitting: Family Medicine

## 2023-08-15 VITALS — BP 108/73 | HR 99 | Resp 16 | Ht 64.0 in | Wt 136.4 lb

## 2023-08-15 DIAGNOSIS — Z309 Encounter for contraceptive management, unspecified: Secondary | ICD-10-CM | POA: Diagnosis not present

## 2023-08-15 DIAGNOSIS — F419 Anxiety disorder, unspecified: Secondary | ICD-10-CM | POA: Diagnosis not present

## 2023-08-15 NOTE — Assessment & Plan Note (Addendum)
Increased anxiety over the past month, possibly related to external stressors. Currently on Zoloft 50mg  daily. Discussed the possibility of increasing the dose or adding an as-needed medication, but patient prefers to continue current regimen and reassess if symptoms persist. GAD7 today =7, previous score 0 on 05/14/22 -Continue Zoloft 50mg  daily. -Consider increasing Zoloft dose to 100mg  or adding as-needed, Buspar 7.5 mg  BID or hydrozyzine 25 mg TID, medication if anxiety persists or worsens - pt agreeable to communicating these needs. -Follow up in 4 weeks if medication changes are made, otherwise follow up in 5-6 months for an annual physical.

## 2023-08-15 NOTE — Assessment & Plan Note (Signed)
IUD in place UTD PAP Sees OB/GYN for mgmt

## 2023-08-15 NOTE — Progress Notes (Signed)
Established Patient Office Visit  Introduced to nurse practitioner role and practice setting.  All questions answered.  Discussed provider/patient relationship and expectations.   Subjective   Patient ID: Maria [name], female    DOB: [birth year]  Age: 33 y.o. MRN: [id number]  Chief Complaint  Patient presents with   Anxiety    The patient, a 33 year old with a history of anxiety, presents for a follow-up consultation. They are currently on a daily dose of 50mg  Zoloft. The patient reports an increase in anxiety over the past month, which they attribute to external factors and current world events. The anxiety episodes are described as sporadic and can occur at random moments, even when nothing significant is happening. The patient has developed coping mechanisms, such as consuming warheads, to manage these sudden bouts of anxiety.  The patient initially scheduled the appointment to ensure the continuation of their Zoloft prescription due to a change in their healthcare provider. However, given the recent increase in anxiety, they are open to the possibility of increasing the dosage if their symptoms do not improve.  In addition to anxiety, the patient is also dealing with breastfeeding a four-year-old child, which they are trying to stop. They are currently on birth control, specifically the Mirena IUD- placed four years ago. They are a Manufacturing systems engineer.  Anxiety Presents for follow-up visit. Symptoms include nervous/anxious behavior and restlessness. Symptoms occur occasionally. The severity of symptoms is moderate. The quality of sleep is fair.   Compliance with medications is 76-100%.       08/15/2023    8:07 AM 11/09/2022    1:07 PM 07/21/2022    8:29 AM  Depression screen PHQ 2/9  Decreased Interest 1 0 0  Down, Depressed, Hopeless 1 0 0  PHQ - 2 Score 2 0 0  Altered sleeping 1 0 0  Tired, decreased energy 1 0 0  Change in appetite 0 0 0  Feeling bad or failure about  yourself  1 0 0  Trouble concentrating 0 0 0  Moving slowly or fidgety/restless 0 0 0  Suicidal thoughts 0 0 0  PHQ-9 Score 5 0 0  Difficult doing work/chores Not difficult at all Not difficult at all Not difficult at all       08/15/2023    8:07 AM 05/14/2022    1:12 PM  GAD 7 : Generalized Anxiety Score  Nervous, Anxious, on Edge 1 0  Control/stop worrying 1 0  Worry too much - different things 1 0  Trouble relaxing 1 0  Restless 1 0  Easily annoyed or irritable 1 0  Afraid - awful might happen 1 0  Total GAD 7 Score 7 0  Anxiety Difficulty Not difficult at all Not difficult at all     Review of Systems  Psychiatric/Behavioral:  The patient is nervous/anxious.   All other systems reviewed and are negative.   Negative unless indicated in HPI   Objective:     BP 108/73   Pulse 99   Resp 16   Ht 5\' 4"  (1.626 m)   Wt 136 lb 6.4 oz (61.9 kg)   LMP 07/24/2023 (Approximate)   SpO2 100%   BMI 23.41 kg/m    Physical Exam Constitutional:      General: She is not in acute distress.    Appearance: Normal appearance. She is normal weight. She is not ill-appearing, toxic-appearing or diaphoretic.  HENT:     Head: Normocephalic.     Mouth/Throat:  Mouth: Mucous membranes are moist.     Comments: Braces present Eyes:     Extraocular Movements: Extraocular movements intact.     Pupils: Pupils are equal, round, and reactive to light.  Cardiovascular:     Rate and Rhythm: Normal rate and regular rhythm.     Pulses: Normal pulses.     Heart sounds: Normal heart sounds. No murmur heard.    No friction rub. No gallop.  Pulmonary:     Effort: Pulmonary effort is normal. No respiratory distress.     Breath sounds: Normal breath sounds. No stridor. No wheezing, rhonchi or rales.  Chest:     Chest wall: No tenderness.  Skin:    General: Skin is warm and dry.     Capillary Refill: Capillary refill takes less than 2 seconds.  Neurological:     General: No focal deficit  present.     Mental Status: She is alert and oriented to person, place, and time. Mental status is at baseline.     Cranial Nerves: No cranial nerve deficit.     Sensory: No sensory deficit.     Motor: No weakness.     Coordination: Coordination normal.     Gait: Gait normal.     Deep Tendon Reflexes: Reflexes normal.  Psychiatric:        Attention and Perception: Attention and perception normal.        Mood and Affect: Mood and affect normal.        Speech: Speech normal.        Behavior: Behavior normal. Behavior is cooperative.        Thought Content: Thought content normal.        Cognition and Memory: Cognition and memory normal.        Judgment: Judgment normal.    No results found for any visits on 08/15/23.    The ASCVD Risk score (Arnett DK, et al., 2019) failed to calculate for the following reasons:   The 2019 ASCVD risk score is only valid for ages 29 to 17    Assessment & Plan:  Anxiety Assessment & Plan: Increased anxiety over the past month, possibly related to external stressors. Currently on Zoloft 50mg  daily. Discussed the possibility of increasing the dose or adding an as-needed medication, but patient prefers to continue current regimen and reassess if symptoms persist. -Continue Zoloft 50mg  daily. -Consider increasing Zoloft dose or adding as-needed medication if anxiety persists or worsens. -Follow up in 4 weeks if medication changes are made, otherwise follow up in 5-6 months for an annual physical.   Encounter for contraceptive management, unspecified type Assessment & Plan: IUD in place UTD PAP Sees OB/GYN for mgmt     Return in about 6 months (around 02/12/2024) for annual physical.   I, Sallee Provencal, FNP, have reviewed all documentation for this visit. The documentation on 08/15/23 for the exam, diagnosis, procedures, and orders are all accurate and complete.   Sallee Provencal, FNP

## 2023-09-05 ENCOUNTER — Other Ambulatory Visit: Payer: Self-pay

## 2023-09-05 DIAGNOSIS — K5900 Constipation, unspecified: Secondary | ICD-10-CM | POA: Insufficient documentation

## 2023-09-05 DIAGNOSIS — Z975 Presence of (intrauterine) contraceptive device: Secondary | ICD-10-CM | POA: Diagnosis not present

## 2023-09-05 DIAGNOSIS — R1030 Lower abdominal pain, unspecified: Secondary | ICD-10-CM | POA: Diagnosis not present

## 2023-09-05 LAB — CBC WITH DIFFERENTIAL/PLATELET
Abs Immature Granulocytes: 0.02 10*3/uL (ref 0.00–0.07)
Basophils Absolute: 0 10*3/uL (ref 0.0–0.1)
Basophils Relative: 1 %
Eosinophils Absolute: 0.1 10*3/uL (ref 0.0–0.5)
Eosinophils Relative: 1 %
HCT: 37 % (ref 36.0–46.0)
Hemoglobin: 12.9 g/dL (ref 12.0–15.0)
Immature Granulocytes: 0 %
Lymphocytes Relative: 37 %
Lymphs Abs: 3.3 10*3/uL (ref 0.7–4.0)
MCH: 31.2 pg (ref 26.0–34.0)
MCHC: 34.9 g/dL (ref 30.0–36.0)
MCV: 89.4 fL (ref 80.0–100.0)
Monocytes Absolute: 0.5 10*3/uL (ref 0.1–1.0)
Monocytes Relative: 6 %
Neutro Abs: 4.9 10*3/uL (ref 1.7–7.7)
Neutrophils Relative %: 55 %
Platelets: 221 10*3/uL (ref 150–400)
RBC: 4.14 MIL/uL (ref 3.87–5.11)
RDW: 12.2 % (ref 11.5–15.5)
WBC: 8.8 10*3/uL (ref 4.0–10.5)
nRBC: 0 % (ref 0.0–0.2)

## 2023-09-05 LAB — POC URINE PREG, ED: Preg Test, Ur: NEGATIVE

## 2023-09-05 NOTE — ED Triage Notes (Signed)
 Pt reports lower abd and pelvic pain for 2 days. Pt denies vaginal bleeding or discharge. Pt denies dysuria. Pt has hx ovarian cyst.

## 2023-09-06 ENCOUNTER — Emergency Department: Payer: BC Managed Care – PPO

## 2023-09-06 ENCOUNTER — Emergency Department
Admission: EM | Admit: 2023-09-06 | Discharge: 2023-09-06 | Disposition: A | Discharge status: Home / Self Care | Payer: BC Managed Care – PPO | Attending: Emergency Medicine | Admitting: Emergency Medicine

## 2023-09-06 DIAGNOSIS — K5900 Constipation, unspecified: Secondary | ICD-10-CM

## 2023-09-06 DIAGNOSIS — R1030 Lower abdominal pain, unspecified: Secondary | ICD-10-CM

## 2023-09-06 DIAGNOSIS — Z975 Presence of (intrauterine) contraceptive device: Secondary | ICD-10-CM | POA: Diagnosis not present

## 2023-09-06 LAB — URINALYSIS, ROUTINE W REFLEX MICROSCOPIC
Bilirubin Urine: NEGATIVE
Glucose, UA: NEGATIVE mg/dL
Ketones, ur: 5 mg/dL — AB
Leukocytes,Ua: NEGATIVE
Nitrite: NEGATIVE
Protein, ur: NEGATIVE mg/dL
Specific Gravity, Urine: 1.018 (ref 1.005–1.030)
pH: 5 (ref 5.0–8.0)

## 2023-09-06 LAB — BASIC METABOLIC PANEL
Anion gap: 13 (ref 5–15)
BUN: 17 mg/dL (ref 6–20)
CO2: 23 mmol/L (ref 22–32)
Calcium: 9.4 mg/dL (ref 8.9–10.3)
Chloride: 99 mmol/L (ref 98–111)
Creatinine, Ser: 0.66 mg/dL (ref 0.44–1.00)
GFR, Estimated: >60 mL/min (ref >60)
Glucose, Bld: 89 mg/dL (ref 70–99)
Potassium: 3.8 mmol/L (ref 3.5–5.1)
Sodium: 135 mmol/L (ref 135–145)

## 2023-09-06 NOTE — Discharge Instructions (Signed)
You can pick up MiraLAX over-the-counter and take 2 capfuls daily until you have daily bowel movements and softer stools and then decrease.  Ultrasound of your pelvic area was reassuring with no acute findings of the uterus or the ovaries.  Please return to the emergency department he has severe worsening sharp abdominal pain that does not go away.

## 2023-09-06 NOTE — ED Provider Notes (Signed)
[facility] Provider Note    Event Date/Time   First MD Initiated Contact with Patient 09/06/23 [phone]     (approximate)   History   Pelvic Pain   HPI Maria [name] is a 33 y.o. female presenting today with lower abdominal pain.  Patient states for the past 2 days she has had intermittent bilateral lower abdominal pain.  Symptoms come and go with no upper abdominal pain.  Mild low back pain.  Intermittent nausea but no vomiting.  Otherwise denies fever, chills, cough, congestion, diarrhea, dysuria, hematuria, vaginal bleeding, vaginal discharge.  She does note prior history of ovarian cysts which have required procedures.  No other reproductive surgeries.  No other abdominal surgeries.  Also notes constipation with last bowel movement approximately 3 days ago and was harder.     Physical Exam   Triage Vital Signs: ED Triage Vitals  Encounter Vitals Group     BP 09/05/23 2327 106/74     Systolic BP Percentile --      Diastolic BP Percentile --      Pulse Rate 09/05/23 2327 73     Resp 09/05/23 2327 20     Temp 09/05/23 2327 98.3 F (36.8 C)     Temp Source 09/05/23 2327 Oral     SpO2 09/05/23 2327 95 %     Weight 09/05/23 2327 134 lb (60.8 kg)     Height 09/05/23 2327 5\' 4"  (1.626 m)     Head Circumference --      Peak Flow --      Pain Score 09/05/23 2330 8     Pain Loc --      Pain Education --      Exclude from Growth Chart --     Most recent vital signs: Vitals:   09/05/23 2327  BP: 106/74  Pulse: 73  Resp: 20  Temp: 98.3 F (36.8 C)  SpO2: 95%   Physical Exam: I have reviewed the vital signs and nursing notes. General: Awake, alert, no acute distress.  Nontoxic appearing. Head:  Atraumatic, normocephalic.   ENT:  EOM intact, PERRL. Oral mucosa is pink and moist with no lesions. Neck: Neck is supple with full range of motion, No meningeal signs. Cardiovascular:  RRR, No murmurs. Peripheral pulses palpable and equal  bilaterally. Respiratory:  Symmetrical chest wall expansion.  No rhonchi, rales, or wheezes.  Good air movement throughout.  No use of accessory muscles.   Musculoskeletal:  No cyanosis or edema. Moving extremities with full ROM Abdomen:  Soft, minimal tenderness to palpation in bilateral lower quadrants, nondistended. Neuro:  GCS 15, moving all four extremities, interacting appropriately. Speech clear. Psych:  Calm, appropriate.   Skin:  Warm, dry, no rash.    ED Results / Procedures / Treatments   Labs (all labs ordered are listed, but only abnormal results are displayed) Labs Reviewed  URINALYSIS, ROUTINE W REFLEX MICROSCOPIC - Abnormal; Notable for the following components:      Result Value   Color, Urine YELLOW (*)    APPearance CLEAR (*)    Hgb urine dipstick SMALL (*)    Ketones, ur 5 (*)    Bacteria, UA RARE (*)    All other components within normal limits  CBC WITH DIFFERENTIAL/PLATELET  BASIC METABOLIC PANEL  POC URINE PREG, ED     EKG    RADIOLOGY Independently interpreted ultrasound pelvis with no acute findings   PROCEDURES:  Critical Care performed: No  Procedures  MEDICATIONS ORDERED IN ED: Medications - No data to display   IMPRESSION / MDM / ASSESSMENT AND PLAN / ED COURSE  I reviewed the triage vital signs and the nursing notes.                              Differential diagnosis includes, but is not limited to, ovarian torsion, ovarian cyst, cyst rupture, constipation  Patient's presentation is most consistent with acute complicated illness / injury requiring diagnostic workup.  Patient is a 33 year old female presenting today for lower abdominal pain which is intermittent associated with mild intermittent nausea.  She has no other concerning features like vaginal bleeding or discharge and no dysuria.  Given her history of ovarian cysts, will get transvaginal ultrasound for further evaluation.  Laboratory workup is reassuring at this time  with no acute findings with normal CBC, BMP, UA, and negative pregnancy test.  Imaging of the pelvic area shows no evidence of torsion or other concerning findings such as cyst or cyst rupture.  Patient was reassessed with no pain symptoms and feeling well at this time.  Given the cyclical nature of the cramping and her notable constipation with no bowel movements in 2 days, do think it is likely that symptoms are more related to constipation at this time.  Do not see any more severe abdominal pain symptoms at this time necessitating CT.  Patient was safe for discharge and will have her on the MiraLAX bowel regimen going home.  Instructed to return if she has any significant severe worsening abdominal pain.  Patient was agreeable with this plan.     FINAL CLINICAL IMPRESSION(S) / ED DIAGNOSES   Final diagnoses:  Constipation, unspecified constipation type  Lower abdominal pain     Rx / DC Orders   ED Discharge Orders     None        Note:  This document was prepared using Dragon voice recognition software and may include unintentional dictation errors.   Janith Lima, MD 09/06/23 [phone]

## 2023-11-02 ENCOUNTER — Other Ambulatory Visit: Payer: Self-pay | Admitting: Family Medicine

## 2023-11-02 DIAGNOSIS — F419 Anxiety disorder, unspecified: Secondary | ICD-10-CM

## 2023-11-02 MED ORDER — SERTRALINE HCL 50 MG PO TABS: 50.0000 mg | ORAL_TABLET | Freq: Every day | ORAL | 0 refills | Status: DC

## 2023-11-02 NOTE — Telephone Encounter (Signed)
[facility] fax[facility] ref[facility] request for the following medications:  sertraline (ZOLOFT) 50 MG tablet   Please advise.  

## 2024-02-01 ENCOUNTER — Other Ambulatory Visit: Payer: Self-pay | Admitting: Family Medicine

## 2024-02-01 DIAGNOSIS — F419 Anxiety disorder, unspecified: Secondary | ICD-10-CM

## 2024-02-13 ENCOUNTER — Ambulatory Visit: Payer: Self-pay | Admitting: Family Medicine

## 2024-02-13 ENCOUNTER — Encounter: Payer: Self-pay | Admitting: Family Medicine

## 2024-02-13 VITALS — BP 98/68 | HR 96 | Temp 99.3°F | Ht 64.0 in | Wt 139.3 lb

## 2024-02-13 DIAGNOSIS — F419 Anxiety disorder, unspecified: Secondary | ICD-10-CM

## 2024-02-13 DIAGNOSIS — L84 Corns and callosities: Secondary | ICD-10-CM | POA: Diagnosis not present

## 2024-02-13 DIAGNOSIS — L408 Other psoriasis: Secondary | ICD-10-CM

## 2024-02-13 DIAGNOSIS — Z0001 Encounter for general adult medical examination with abnormal findings: Secondary | ICD-10-CM

## 2024-02-13 DIAGNOSIS — Z23 Encounter for immunization: Secondary | ICD-10-CM

## 2024-02-13 DIAGNOSIS — Z0000 Encounter for general adult medical examination without abnormal findings: Secondary | ICD-10-CM

## 2024-02-13 MED ORDER — SERTRALINE HCL 50 MG PO TABS: 75.0000 mg | ORAL_TABLET | Freq: Every day | ORAL | 1 refills | Status: DC

## 2024-02-13 MED ORDER — TRIAMCINOLONE ACETONIDE 0.025 % EX OINT: 1.0000 | TOPICAL_OINTMENT | Freq: Two times a day (BID) | CUTANEOUS | 1 refills | Status: DC

## 2024-02-13 NOTE — Progress Notes (Signed)
 y  Complete physical exam  Patient: Maria [name]   DOB: [birth year]   32 y.o. Female  MRN: [id number]  Introduced to nurse practitioner role and practice setting.  All questions answered.  Discussed provider/patient relationship and expectations.   Subjective:    Chief Complaint  Patient presents with   Annual Exam    Last CPE- 11/09/2022 Diet- General, unhealthy Exercise- Not routine oriented, but job is active (childcare) Feeling- Been anxious more than usual for a couple of months. Sleep- Well Concerns- Mainly anxiety  Hepatitis B Vaccine- yes HPV Vaccine- yes  Last Pap- 10/30/2022, stated to repeat in 3 years    Maria [name] is a 33 y.o. female who presents today for a complete physical exam. She reports consuming a general diet. The patient does not participate in regular exercise at present. She generally feels fairly well. She reports sleeping fairly well. She does have additional problems to discuss today.   Discussed the use of AI scribe software for clinical note transcription with the patient, who gave verbal consent to proceed.  History of Present Illness Maria [name] is a 33 year old female with anxiety who presents for physical , has concerns for anxiety medication management  and evaluation of skin changes on her knees.  Her anxiety had been stable, and she is currently taking 50 mg of sertraline  daily. She is considering increasing the dosage to 75 mg daily, but she is cautious about adjusting. She is concerned about the state of the world and its impact on her anxiety, particularly as a parent of an autistic child. She does not currently see a therapist but discusses her concerns with her husband.  She has noticed skin changes on her knees since the summer. Thickened, raised skin. The affected areas are itchy, especially after showering, but the itching subsides with lotion application. There is no bleeding, drainage, or discharge. She frequently kneels on  mats at work, which may contribute to the condition.  Her current medications include sertraline  50 mg daily, a women's multivitamin, and vitamin D. She has an IUD for birth control and experiences a light menstrual period monthly. She is not currently breastfeeding.  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 She wears Invisalign. She wears prescription glasses and is due for an eye check-up.   Most recent fall risk assessment:    02/13/2024   10:56 AM  Fall Risk   Falls in the past year? 0  Number falls in past yr: 0  Injury with Fall? 0  Risk for fall due to : No Fall Risks     Most recent depression screenings:    02/13/2024   10:56 AM 08/15/2023    8:07 AM  PHQ 2/9 Scores  PHQ - 2 Score 3 2  PHQ- 9 Score 10 5      02/13/2024   10:56 AM 08/15/2023    8:07 AM 05/14/2022    1:12 PM  GAD 7 : Generalized Anxiety Score  Nervous, Anxious, on Edge 2 1 0  Control/stop worrying 2 1 0  Worry too much - different things 2 1 0  Trouble relaxing 2 1 0  Restless 2 1 0  Easily annoyed or irritable 3 1 0  Afraid - awful might happen 2 1 0  Total GAD 7 Score 15 7 0  Anxiety Difficulty Somewhat difficult Not difficult at all Not difficult at all      Vision:Within last  year and Dental: No current dental problems and Current dental problems  Patient Active Problem List   Diagnosis Date Noted   Annual physical exam 11/09/2022   Screening for cervical cancer 11/09/2022   Encounter for contraceptive management 11/09/2022   Anxiety 05/15/2021   Past Medical History:  Diagnosis Date   ADHD (attention deficit hyperactivity disorder)    Anxiety    Past Surgical History:  Procedure Laterality Date   LAPAROSCOPIC OVARIAN CYSTECTOMY Left 03/01/2016   Procedure: LAPAROSCOPIC OVARIAN CYSTECTOMY;  Surgeon: Heather Penton, MD;  Location: [facility];  Service: Gynecology;  Laterality: Left;   LAPAROSCOPY N/A 03/01/2016   Procedure:  LAPAROSCOPY DIAGNOSTIC;  Surgeon: Heather Penton, MD;  Location: [facility];  Service: Gynecology;  Laterality: N/A;   Mirena  insertion  09/28/2019   WISDOM TOOTH EXTRACTION     WISDOM TOOTH EXTRACTION     Social History   Tobacco Use   Smoking status: Never   Smokeless tobacco: Current    Types: Chew  Vaping Use   Vaping status: Never Used  Substance Use Topics   Alcohol use: Not Currently    Comment: Once in a blue moon   Drug use: No   No Known Allergies    Patient Care Team: Wellington Curtis LABOR, FNP as PCP - General (Family Medicine)   Outpatient Medications Prior to Visit  Medication Sig   levonorgestrel  (MIRENA ) 20 MCG/24HR IUD 1 each by Intrauterine route once.   [DISCONTINUED] sertraline  (ZOLOFT ) 50 MG tablet TAKE 1 TABLET BY MOUTH EVERY DAY   No facility-administered medications prior to visit.    Review of Systems  Psychiatric/Behavioral:  Positive for depression. The patient is nervous/anxious.        Objective:     BP 98/68 (BP Location: Left Arm, Patient Position: Sitting, Cuff Size: Normal)   Pulse 96   Temp 99.3 F (37.4 C) (Oral)   Ht 5' 4 (1.626 m)   Wt 139 lb 4.8 oz (63.2 kg)   SpO2 100%   BMI 23.91 kg/m  BP Readings from Last 3 Encounters:  02/13/24 98/68  09/05/23 106/74  08/15/23 108/73   Wt Readings from Last 3 Encounters:  02/13/24 139 lb 4.8 oz (63.2 kg)  09/05/23 134 lb (60.8 kg)  08/15/23 136 lb 6.4 oz (61.9 kg)     Physical Exam Constitutional:      General: She is not in acute distress.    Appearance: Normal appearance. She is normal weight. She is not ill-appearing.  HENT:     Head: Normocephalic.     Right Ear: Tympanic membrane, ear canal and external ear normal. There is no impacted cerumen.     Left Ear: Tympanic membrane, ear canal and external ear normal. There is no impacted cerumen.     Nose: Nose normal. No congestion or rhinorrhea.     Mouth/Throat:     Mouth: Mucous membranes are moist.     Pharynx:  Oropharynx is clear. No oropharyngeal exudate or posterior oropharyngeal erythema.  Eyes:     General: Lids are normal.     Extraocular Movements: Extraocular movements intact.     Right eye: Normal extraocular motion.     Left eye: Normal extraocular motion.     Conjunctiva/sclera: Conjunctivae normal.     Right eye: Right conjunctiva is not injected.     Left eye: Left conjunctiva is not injected.     Pupils: Pupils are equal, round, and reactive to light.  Neck:     Thyroid:  No thyroid mass, thyromegaly or thyroid tenderness.     Vascular: No carotid bruit.  Cardiovascular:     Rate and Rhythm: Normal rate and regular rhythm.     Pulses: Normal pulses.          Radial pulses are 2+ on the right side and 2+ on the left side.       Posterior tibial pulses are 2+ on the right side and 2+ on the left side.     Heart sounds: Normal heart sounds, S1 normal and S2 normal. No murmur heard.    No friction rub. No gallop.  Pulmonary:     Effort: Pulmonary effort is normal. No respiratory distress.     Breath sounds: Normal breath sounds. No stridor. No wheezing, rhonchi or rales.  Chest:     Chest wall: No tenderness.     Comments: Not assessed, denies concerns. Abdominal:     General: Bowel sounds are normal. There is no distension.     Palpations: Abdomen is soft. There is no mass.     Tenderness: There is no abdominal tenderness. There is no right CVA tenderness, left CVA tenderness, guarding or rebound.     Hernia: No hernia is present.  Genitourinary:    Comments: UTD on pap, denies concerns. Musculoskeletal:        General: No swelling or tenderness. Normal range of motion.     Cervical back: Normal range of motion. No rigidity or tenderness.  Lymphadenopathy:     Cervical: No cervical adenopathy.     Right cervical: No superficial, deep or posterior cervical adenopathy.    Left cervical: No superficial, deep or posterior cervical adenopathy.  Skin:    General: Skin is warm  and dry.     Capillary Refill: Capillary refill takes less than 2 seconds.     Findings: Rash present. No bruising or erythema. Rash is scaling.         Comments: Bilateral scaly, raised, macules on knees.  Neurological:     General: No focal deficit present.     Mental Status: She is alert and oriented to person, place, and time. Mental status is at baseline.     GCS: GCS eye subscore is 4. GCS verbal subscore is 5. GCS motor subscore is 6.     Cranial Nerves: No cranial nerve deficit.     Sensory: No sensory deficit.     Motor: No weakness, tremor or pronator drift.     Coordination: Romberg sign negative. Coordination normal.     Gait: Gait is intact. Gait normal.  Psychiatric:        Attention and Perception: Attention and perception normal.        Mood and Affect: Mood and affect normal.        Speech: Speech normal.        Behavior: Behavior normal. Behavior is cooperative.        Thought Content: Thought content normal.        Cognition and Memory: Cognition and memory normal.        Judgment: Judgment normal.      No results found for any visits on 02/13/24.     Assessment & Plan:    Routine Health Maintenance and Physical Exam  Assessment and Plan Assessment & Plan Generalized Anxiety Disorder Anxiety improved on sertraline  50 mg, but with recent worldly events and current political environment increased anxiety, especially with having an autistic child. Discussed gradual increase to 75 mg  based on current anxiety levels and comfort with approach. - Increase sertraline  to 75 mg daily, starting with 75 mg every other day for two weeks, then increase to 75 mg daily. - Schedule follow-up in 4 weeks to assess mood and medication adjustment.  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 Chronic use of knees at work, leading to callused knees vs psoriasis  Steroid cream treatment planned. - Prescribe topical steroid cream  twice daily to affected area. - Recommend Aquaphor or similar moisturizers. - Advise using unscented detergents and body soaps. - Consider antifungal treatment if no improvement.  General Health Maintenance Up to date on Pap smear, IUD for contraception, takes multivitamin and vitamin D, due for eye exam, scheduled for second HPV vaccine dose, received Hepatitis B vaccine. - Administer second dose of HPV vaccine in four weeks along with mood visit. - Ensure up to date with Hepatitis B vaccine. - Encourage scheduling an eye exam. - Continue current multivitamin and vitamin D supplementation. - Will check CMP, Lipid, CBC, TSH  Things to do to keep yourself healthy  - Exercise at least 30-45 minutes a day, 3-4 days a week.  - Eat a low-fat diet with lots of fruits and vegetables, up to 7-9 servings per day.  - Seatbelts can save your life. Wear them always.  - Smoke detectors on every level of your home, check batteries every year.  - Eye Doctor - have an eye exam every 1-2 years  - Safe sex - if you may be exposed to STDs, use a condom.  - Alcohol -  If you drink, do it moderately, less than 2 drinks per day.  - Health Care Power of Attorney. Choose someone to speak for you if you are not able.  - Depression is common in our stressful world.If you're feeling down or losing interest in things you normally enjoy, please come in for a visit.  - Violence - If anyone is threatening or hurting you, please call immediately.   Health Maintenance  Topic Date Due   Hepatitis B Vaccine (1 of 3 - 19+ 3-dose series) Never done   HPV Vaccine (1 - 3-dose SCDM series) Never done   COVID-19 Vaccine (3 - 2024-25 season) 03/27/2023   Flu Shot  02/24/2024   Pap with HPV screening  11/08/2025   DTaP/Tdap/Td vaccine (2 - Td or Tdap) 10/14/2027   Hepatitis C Screening  Completed   HIV Screening  Completed   Meningitis B Vaccine  Aged Out    Discussed health benefits of physical activity, and encouraged  her to engage in regular exercise appropriate for her age and condition.  Annual physical exam -     Lipid panel -     TSH -     Comprehensive metabolic panel with GFR -     CBC  Anxiety -     Sertraline  HCl; Take 1.5 tablets (75 mg total) by mouth daily.  Dispense: 90 tablet; Refill: 1  Other psoriasis -     Triamcinolone  Acetonide; Apply 1 Application topically 2 (two) times daily.  Dispense: 80 g; Refill: 1  Need for hepatitis B vaccination -     Heplisav-B  (HepB-CPG) Vaccine  Need for HPV vaccination -     HPV 9-valent vaccine,Recombinat     Return in about 4 weeks (around 03/12/2024) for Mood and HPV vax (2nd dose).    I, Curtis DELENA Boom, FNP, have reviewed all documentation for this visit. The documentation  on 02/13/24 for the exam, diagnosis, procedures, and orders are all accurate and complete.   Curtis DELENA Boom, FNP

## 2024-02-27 DIAGNOSIS — Z0000 Encounter for general adult medical examination without abnormal findings: Secondary | ICD-10-CM | POA: Diagnosis not present

## 2024-02-28 LAB — CBC
Hematocrit: 36.9 % (ref 34.0–46.6)
Hemoglobin: 12.5 g/dL (ref 11.1–15.9)
MCH: 30.9 pg (ref 26.6–33.0)
MCHC: 33.9 g/dL (ref 31.5–35.7)
MCV: 91 fL (ref 79–97)
Platelets: 219 x10E3/uL (ref 150–450)
RBC: 4.04 x10E6/uL (ref 3.77–5.28)
RDW: 11.8 % (ref 11.7–15.4)
WBC: 9.8 x10E3/uL (ref 3.4–10.8)

## 2024-02-28 LAB — COMPREHENSIVE METABOLIC PANEL WITH GFR
ALT: 18 IU/L (ref 0–32)
AST: 20 IU/L (ref 0–40)
Albumin: 4.4 g/dL (ref 4.0–5.0)
Alkaline Phosphatase: 75 IU/L (ref 44–121)
BUN/Creatinine Ratio: 20 (ref 9–23)
BUN: 11 mg/dL (ref 6–20)
Bilirubin Total: 0.2 mg/dL (ref 0.0–1.2)
CO2: 21 mmol/L (ref 20–29)
Calcium: 9 mg/dL (ref 8.7–10.2)
Chloride: 102 mmol/L (ref 96–106)
Creatinine, Ser: 0.56 mg/dL — ABNORMAL LOW (ref 0.57–1.00)
Globulin, Total: 2.2 g/dL (ref 1.5–4.5)
Glucose: 82 mg/dL (ref 70–99)
Potassium: 4 mmol/L (ref 3.5–5.2)
Sodium: 138 mmol/L (ref 134–144)
Total Protein: 6.6 g/dL (ref 6.0–8.5)
eGFR: 126 mL/min/1.73 (ref >59)

## 2024-02-28 LAB — LIPID PANEL
Chol/HDL Ratio: 2.8 ratio (ref 0.0–4.4)
Cholesterol, Total: 181 mg/dL (ref 100–199)
HDL: 64 mg/dL (ref >39)
LDL Chol Calc (NIH): 106 mg/dL — ABNORMAL HIGH (ref 0–99)
Triglycerides: 54 mg/dL (ref 0–149)
VLDL Cholesterol Cal: 11 mg/dL (ref 5–40)

## 2024-02-28 LAB — TSH: TSH: 1.25 u[IU]/mL (ref 0.450–4.500)

## 2024-02-29 ENCOUNTER — Ambulatory Visit: Payer: Self-pay | Admitting: Family Medicine

## 2024-03-02 IMAGING — CR DG CHEST 2V
2 series · 2 of 2 positions shown · non-contrast
Comparison: None.

CLINICAL DATA: Nausea.

EXAM:
CHEST - 2 VIEW

[chest pa]
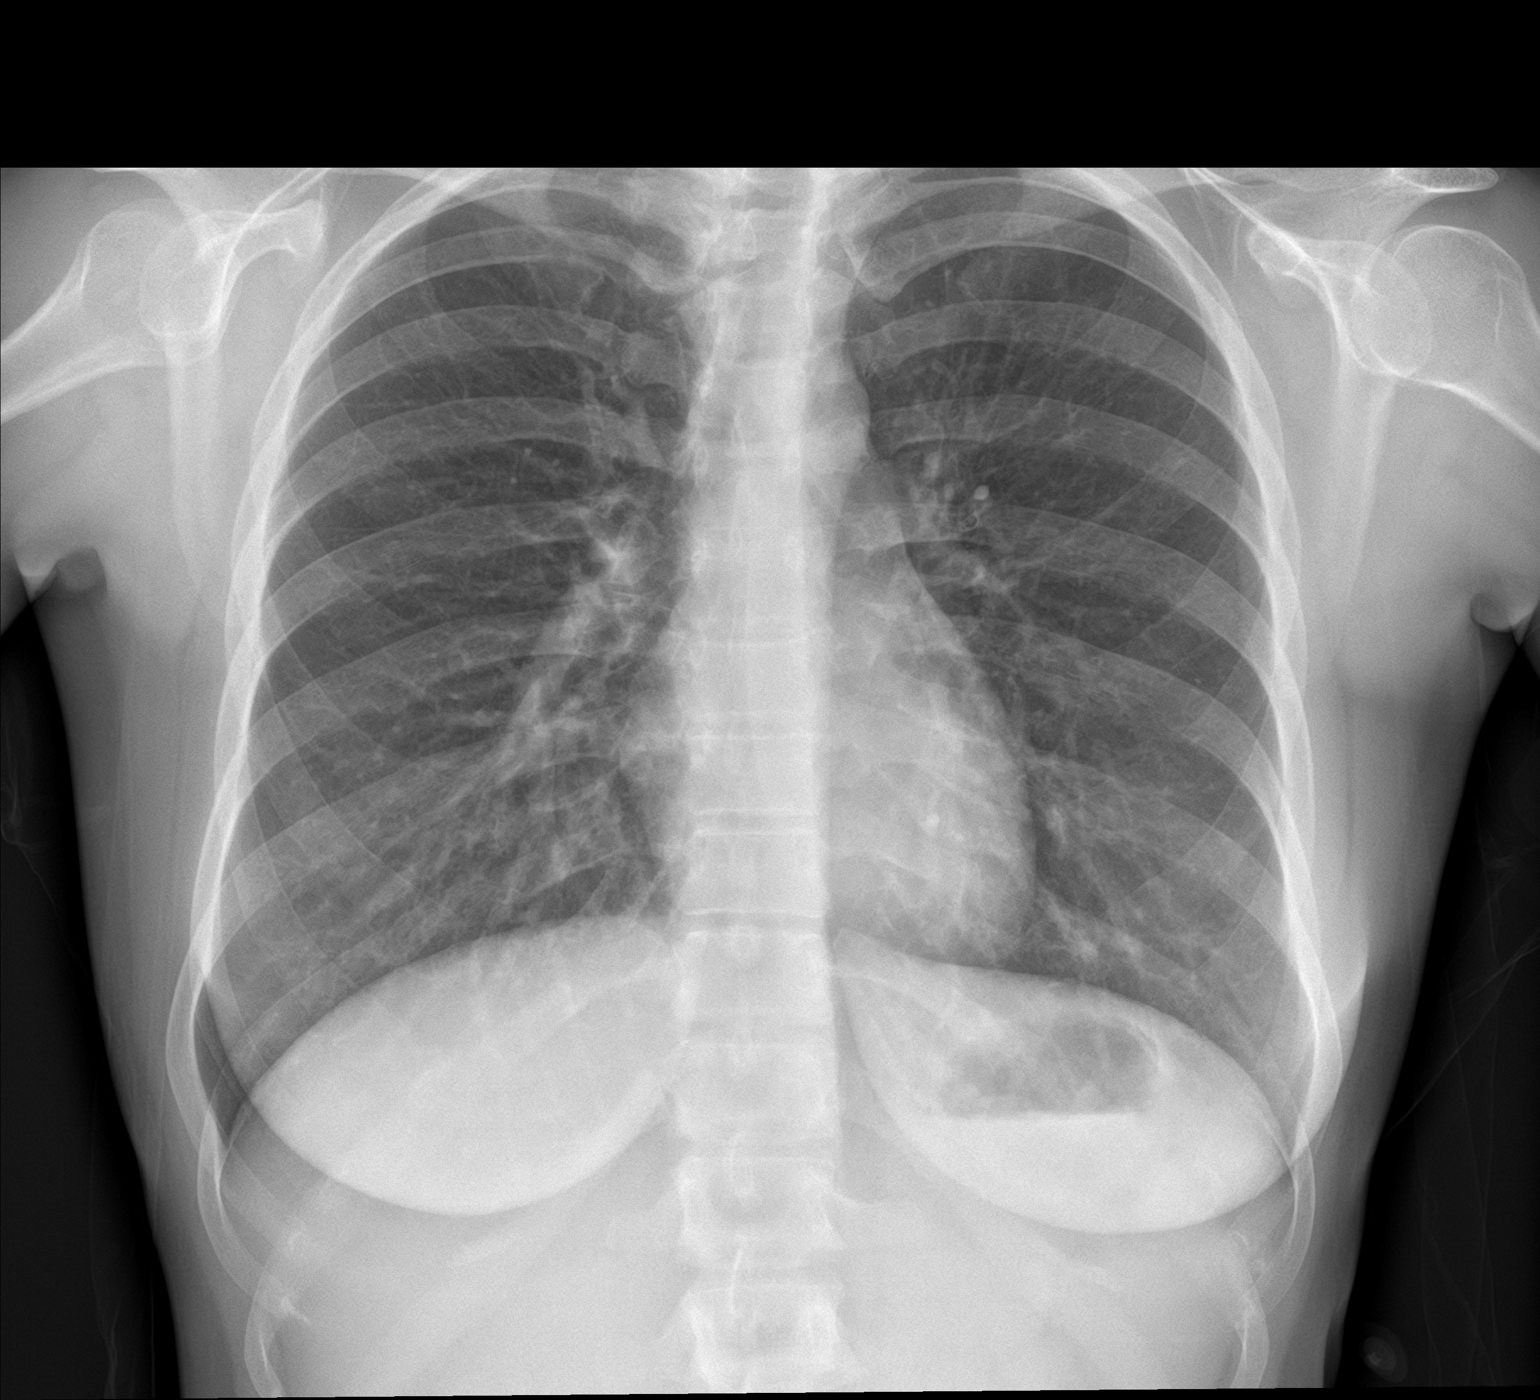

[chest lat]
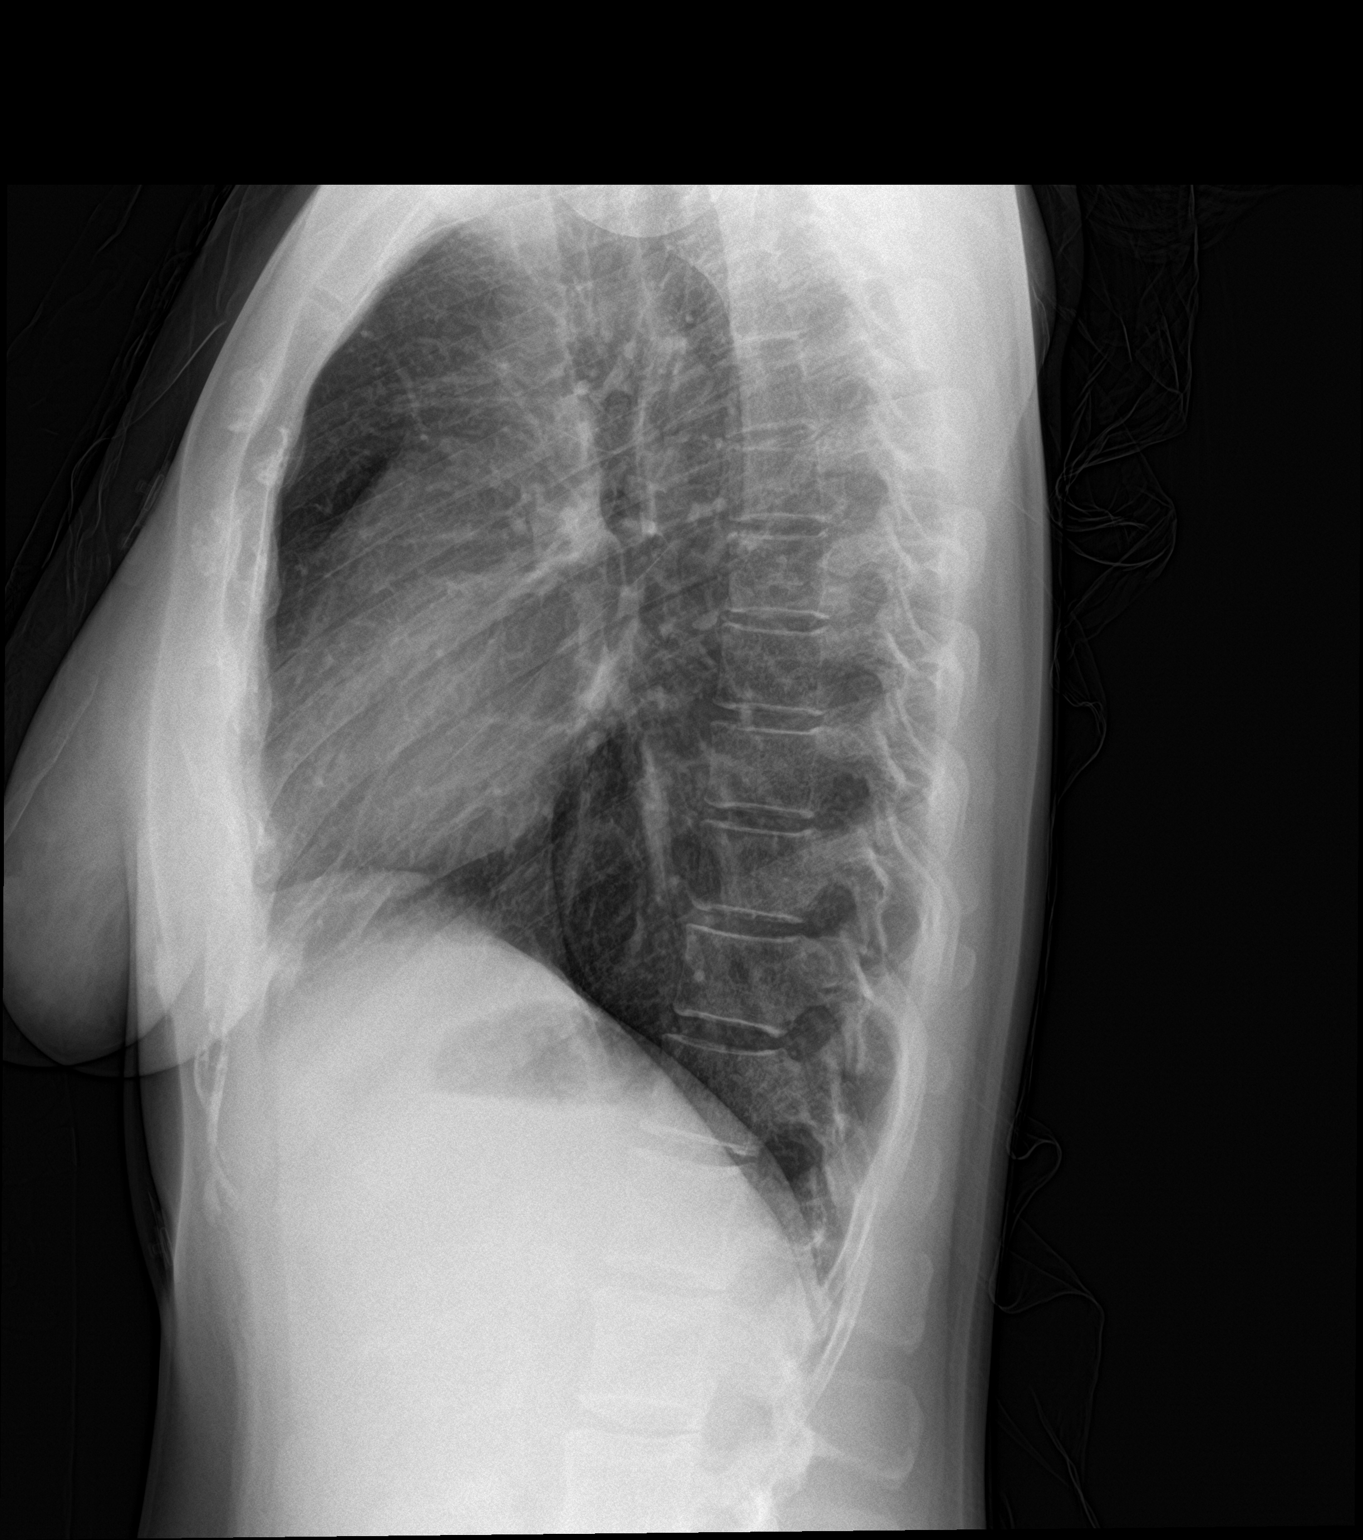

[2 of 2 positions shown; findings below may reference images not displayed]

FINDINGS: The heart size and mediastinal contours are within normal limits.
Both lungs are clear. The visualized skeletal structures are
unremarkable.
IMPRESSION: No active cardiopulmonary disease.

## 2024-03-12 ENCOUNTER — Ambulatory Visit: Admitting: Family Medicine

## 2024-03-12 ENCOUNTER — Encounter: Payer: Self-pay | Admitting: Family Medicine

## 2024-03-12 VITALS — BP 104/73 | HR 119 | Temp 98.3°F | Ht 64.0 in | Wt 138.0 lb

## 2024-03-12 DIAGNOSIS — Z23 Encounter for immunization: Secondary | ICD-10-CM | POA: Diagnosis not present

## 2024-03-12 DIAGNOSIS — L408 Other psoriasis: Secondary | ICD-10-CM | POA: Diagnosis not present

## 2024-03-12 DIAGNOSIS — F419 Anxiety disorder, unspecified: Secondary | ICD-10-CM

## 2024-03-12 MED ORDER — SERTRALINE HCL 50 MG PO TABS: 75.0000 mg | ORAL_TABLET | Freq: Every day | ORAL | 1 refills | Status: DC

## 2024-03-12 MED ORDER — TRIAMCINOLONE ACETONIDE 0.025 % EX OINT: 1.0000 | TOPICAL_OINTMENT | Freq: Two times a day (BID) | CUTANEOUS | 1 refills | Status: AC

## 2024-03-12 NOTE — Progress Notes (Signed)
 Established Patient Office Visit  Introduced to nurse practitioner role and practice setting.  All questions answered.  Discussed provider/patient relationship and expectations.  Subjective   Patient ID: Maria [name], female    DOB: [birth year]  Age: 33 y.o. MRN: [id number]  Chief Complaint  Patient presents with   Follow-up    Patient is here to follow up on her mood states that it has got better since her anxiety medications was increased as far as dosage.  Patient is also getting her second HPV vaccine.   Discussed the use of AI scribe software for clinical note transcription with the patient, who gave verbal consent to proceed.  History of Present Illness Maria [name] is a 33 year old female who presents for follow-up of anxiety management and skin condition treatment.  Her anxiety symptoms have improved since the last visit, with a decrease in her GAD-7 score from 15 to 7, indicating reduced symptom severity. She describes the anxiety as 'still there, but it definitely has improved.'   She has been using a steroid cream for a bilateral knee psoriasis, which she believes has helped improve her symptoms. Needs refills      03/12/2024    1:08 PM 02/13/2024   10:56 AM 08/15/2023    8:07 AM  Depression screen PHQ 2/9  Decreased Interest 1 1 1   Down, Depressed, Hopeless 1 2 1   PHQ - 2 Score 2 3 2   Altered sleeping 0 1 1  Tired, decreased energy 1 3 1   Change in appetite 0 0 0  Feeling bad or failure about yourself  1 3 1   Trouble concentrating 1 0 0  Moving slowly or fidgety/restless 1 0 0  Suicidal thoughts 0 0 0  PHQ-9 Score 6 10 5   Difficult doing work/chores Not difficult at all Somewhat difficult Not difficult at all       03/12/2024    1:08 PM 02/13/2024   10:56 AM 08/15/2023    8:07 AM 05/14/2022    1:12 PM  GAD 7 : Generalized Anxiety Score  Nervous, Anxious, on Edge 1 2 1  0  Control/stop worrying 1 2 1  0  Worry too much - different things 1 2 1  0  Trouble  relaxing 1 2 1  0  Restless 1 2 1  0  Easily annoyed or irritable 1 3 1  0  Afraid - awful might happen 1 2 1  0  Total GAD 7 Score 7 15 7  0  Anxiety Difficulty Not difficult at all Somewhat difficult Not difficult at all Not difficult at all    ROS  Negative unless indicated in HPI   Objective:     BP 104/73 (BP Location: Left Arm, Patient Position: Sitting, Cuff Size: Normal)   Pulse (!) 119   Temp 98.3 F (36.8 C) (Oral)   Ht 5' 4 (1.626 m)   Wt 138 lb (62.6 kg)   SpO2 99%   BMI 23.69 kg/m    Physical Exam Constitutional:      General: She is not in acute distress.    Appearance: Normal appearance. She is normal weight. She is not toxic-appearing or diaphoretic.  HENT:     Head: Normocephalic.     Nose: Nose normal.     Mouth/Throat:     Mouth: Mucous membranes are moist.     Pharynx: Oropharynx is clear.  Eyes:     Extraocular Movements: Extraocular movements intact.     Pupils: Pupils are equal, round, and reactive to  light.  Neck:     Vascular: No carotid bruit.  Cardiovascular:     Rate and Rhythm: Normal rate and regular rhythm.     Pulses: Normal pulses.     Heart sounds: Normal heart sounds. No murmur heard.    No friction rub. No gallop.  Pulmonary:     Effort: No respiratory distress.     Breath sounds: No stridor. No wheezing, rhonchi or rales.  Chest:     Chest wall: No tenderness.  Abdominal:     Tenderness: There is no right CVA tenderness or left CVA tenderness.  Musculoskeletal:     Right lower leg: No edema.     Left lower leg: No edema.  Lymphadenopathy:     Cervical: No cervical adenopathy.  Skin:    General: Skin is warm and dry.     Capillary Refill: Capillary refill takes less than 2 seconds.  Neurological:     General: No focal deficit present.     Mental Status: She is alert and oriented to person, place, and time. Mental status is at baseline.     GCS: GCS eye subscore is 4. GCS verbal subscore is 5. GCS motor subscore is 6.      Cranial Nerves: Cranial nerves 2-12 are intact.  Psychiatric:        Attention and Perception: Attention and perception normal.        Mood and Affect: Mood and affect normal.        Speech: Speech normal.        Behavior: Behavior normal. Behavior is cooperative.        Thought Content: Thought content normal.        Cognition and Memory: Cognition and memory normal.        Judgment: Judgment normal.      No results found for any visits on 03/12/24.    The ASCVD Risk score (Arnett DK, et al., 2019) failed to calculate for the following reasons:   The 2019 ASCVD risk score is only valid for ages 67 to 41    Assessment & Plan:   Assessment and Plan Assessment & Plan Generalized anxiety disorder Generalized anxiety disorder has improved with the increase in Sertraline  to 75mg  daily. GAD-7 score decreased from 15 to 7, indicating a reduction in symptoms from somewhat difficult to not difficult at all. She is satisfied with the current dose and reports no major side effects. - Continue Sertraline  75 mg daily - Provide a prescription refill for six months. - Schedule a six-month follow-up to monitor mood and administer the final HPV vaccine.  Dermatitis/ Psoriasis Dermatitis is improving with the use of steroid cream. The affected area appears better and less raised, erythematous and callused. No referral to dermatology was made as the condition is responding well to current treatment. - Refill triamcinolone   - Continue to use emollients to knees as well. - Use unscented soaps.  Return in about 5 months (around 08/12/2024) for Mood and Final HPV vacccine.   I, Curtis DELENA Boom, FNP, have reviewed all documentation for this visit. The documentation on 03/12/24 for the exam, diagnosis, procedures, and orders are all accurate and complete.   Curtis DELENA Boom, FNP

## 2024-05-05 ENCOUNTER — Other Ambulatory Visit: Payer: Self-pay | Admitting: Family Medicine

## 2024-05-05 DIAGNOSIS — F419 Anxiety disorder, unspecified: Secondary | ICD-10-CM

## 2024-05-22 DIAGNOSIS — Z6823 Body mass index (BMI) 23.0-23.9, adult: Secondary | ICD-10-CM | POA: Diagnosis not present

## 2024-05-22 DIAGNOSIS — R300 Dysuria: Secondary | ICD-10-CM | POA: Diagnosis not present

## 2024-08-13 ENCOUNTER — Ambulatory Visit: Admitting: Family Medicine

## 2024-08-13 ENCOUNTER — Encounter: Payer: Self-pay | Admitting: Family Medicine

## 2024-08-13 VITALS — BP 105/63 | HR 98 | Temp 98.7°F | Ht 64.0 in | Wt 139.2 lb

## 2024-08-13 DIAGNOSIS — Z975 Presence of (intrauterine) contraceptive device: Secondary | ICD-10-CM

## 2024-08-13 DIAGNOSIS — L408 Other psoriasis: Secondary | ICD-10-CM | POA: Diagnosis not present

## 2024-08-13 DIAGNOSIS — F411 Generalized anxiety disorder: Secondary | ICD-10-CM

## 2024-08-13 DIAGNOSIS — Z23 Encounter for immunization: Secondary | ICD-10-CM | POA: Diagnosis not present

## 2024-08-13 NOTE — Progress Notes (Signed)
 "   Established patient visit   Patient: Maria [name]   DOB: 1991/05/14   33 y.o. Female  MRN: [id number] Visit Date: 08/13/2024  Today's healthcare provider: LAURAINE LOISE BUOY, DO   Chief Complaint  Patient presents with   Transitions Of Care    Former patient of Curtis Clifton's transferring care to another provider, expresses no concerns.  Vaccines- yes   Subjective    Maria [name] is a 34 y.o. female who presents today as a established patient for transition of care.  HPI HPI     Transitions Of Care    Additional comments: Former patient of Kellie Clifton's transferring care to another provider, expresses no concerns.  Vaccines- yes      Last edited by Terrel Powell CROME, CMA on 08/13/2024 10:21 AM.       Maria [name] is a 34 year old female who presents for follow-up and HPV vaccination.  She is here for the administration of her influenza vaccine and follow-up on anxiety management. She feels good since her last visit and continues on sertraline  75 mg, taking a pill and a half daily. This dosage has been effective for her since it was adjusted in July, and she has been stable on this dose since then.  Regarding her vaccination history, she believes she received all her vaccines during childhood, including hepatitis B. She plans to verify her vaccination records with her mother or through school records. She has attempted to get a COVID booster at the pharmacy but has faced scheduling difficulties.  She uses Kenalog  ointment for a psoriasis rash, which is working well. She has a Mirena  IUD placed in April 2021 for birth control and reports no issues now or previously with heavy menstrual bleeding. She is cautious about sexually transmitted infections and is sexually active only with the same partner.  She has a history of ADHD but reports no current issues and does not take medication for it. Her current medications include sertraline  and Kenalog   ointment.     Past Medical History:  Diagnosis Date   ADHD (attention deficit hyperactivity disorder)    Anxiety    Past Surgical History:  Procedure Laterality Date   LAPAROSCOPIC OVARIAN CYSTECTOMY Left 03/01/2016   Procedure: LAPAROSCOPIC OVARIAN CYSTECTOMY;  Surgeon: Heather Penton, MD;  Location: [facility];  Service: Gynecology;  Laterality: Left;   LAPAROSCOPY N/A 03/01/2016   Procedure: LAPAROSCOPY DIAGNOSTIC;  Surgeon: Heather Penton, MD;  Location: [facility];  Service: Gynecology;  Laterality: N/A;   Mirena  insertion  09/28/2019   WISDOM TOOTH EXTRACTION     WISDOM TOOTH EXTRACTION     Family Status  Relation Name Status   Mother  Alive   Father  Alive  No partnership data on file   Family History  Problem Relation Age of Onset   Healthy Mother    Healthy Father    Social History   Socioeconomic History   Marital status: Married    Spouse name: Not on file   Number of children: Not on file   Years of education: Not on file   Highest education level: Not on file  Occupational History   Not on file  Tobacco Use   Smoking status: Never   Smokeless tobacco: Current    Types: Chew   Tobacco comments:    Pouch only does it once a week  Vaping Use   Vaping status: Never Used  Substance and Sexual Activity   Alcohol use: Not Currently  Comment: Once in a blue moon   Drug use: No   Sexual activity: Yes    Birth control/protection: I.U.D.    Comment: Mirena   Other Topics Concern   Not on file  Social History Narrative   Not on file   Social Drivers of Health   Tobacco Use: High Risk (08/13/2024)   Patient History    Smoking Tobacco Use: Never    Smokeless Tobacco Use: Current    Passive Exposure: Not on file  Financial Resource Strain: Low Risk (08/13/2024)   Overall Financial Resource Strain (CARDIA)    Difficulty of Paying Living Expenses: Not hard at all  Food Insecurity: No Food Insecurity (08/13/2024)   Epic    Worried About Programme Researcher, Broadcasting/film/video  in the Last Year: Never true    Ran Out of Food in the Last Year: Never true  Transportation Needs: No Transportation Needs (08/13/2024)   Epic    Lack of Transportation (Medical): No    Lack of Transportation (Non-Medical): No  Physical Activity: Sufficiently Active (08/13/2024)   Exercise Vital Sign    Days of Exercise per Week: 5 days    Minutes of Exercise per Session: 150+ min  Stress: No Stress Concern Present (08/13/2024)   [employer] of Occupational Health - Occupational Stress Questionnaire    Feeling of Stress: Not at all  Social Connections: Socially Isolated (08/13/2024)   Social Connection and Isolation Panel    Frequency of Communication with Friends and Family: Once a week    Frequency of Social Gatherings with Friends and Family: Once a week    Attends Religious Services: Never    Database Administrator or Organizations: No    Attends Banker Meetings: Never    Marital Status: Married  Depression (PHQ2-9): Medium Risk (03/12/2024)   Depression (PHQ2-9)    PHQ-2 Score: 6  Alcohol Screen: Low Risk (11/09/2022)   Alcohol Screen    Last Alcohol Screening Score (AUDIT): 1  Housing: Low Risk (08/13/2024)   Epic    Unable to Pay for Housing in the Last Year: No    Number of Times Moved in the Last Year: 0    Homeless in the Last Year: No  Utilities: Not At Risk (08/13/2024)   Epic    Threatened with loss of utilities: No  Health Literacy: Adequate Health Literacy (08/13/2024)   B1300 Health Literacy    Frequency of need for help with medical instructions: Never   Show/hide medication list[1] Allergies[2]  Immunization History  Administered Date(s) Administered   HPV 9-valent 02/13/2024, 03/12/2024   Hepb-cpg 02/13/2024   Influenza,inj,Quad PF,6+ Mos 05/11/2018, 04/11/2019, 05/15/2021, 05/14/2022   Influenza-Unspecified 04/06/2019   Moderna Sars-Covid-2 Vaccination 09/22/2019, 10/20/2019   PPD Test 09/24/2014, 12/23/2015, 02/15/2016    Pfizer(Comirnaty)Fall Seasonal Vaccine 12 years and older 05/15/2022   Tdap 10/13/2017   Varicella 08/15/2019    Health Maintenance  Topic Date Due   Influenza Vaccine  02/24/2024   HPV VACCINES (3 - Risk 3-dose series) 08/15/2024   COVID-19 Vaccine (4 - 2025-26 season) 03/25/2025 (Originally 03/26/2024)   Hepatitis B Vaccines 19-59 Average Risk (2 of 2 - CpG 2-dose series) 08/13/2025 (Originally 03/12/2024)   Cervical Cancer Screening (HPV/Pap Cotest)  11/08/2025   DTaP/Tdap/Td (2 - Td or Tdap) 10/14/2027   Hepatitis C Screening  Completed   HIV Screening  Completed   Pneumococcal Vaccine  Aged Out   Meningococcal B Vaccine  Aged Out    Patient Care Team: [place],  Lauraine SAILOR, DO as PCP - General (Family Medicine)       Objective    BP 105/63 (BP Location: Left Arm, Patient Position: Sitting, Cuff Size: Normal)   Pulse 98   Temp 98.7 F (37.1 C) (Oral)   Ht 5' 4 (1.626 m)   Wt 139 lb 3.2 oz (63.1 kg)   SpO2 98%   Breastfeeding No   BMI 23.89 kg/m     Physical Exam Constitutional:      Appearance: Normal appearance.  HENT:     Head: Normocephalic and atraumatic.  Eyes:     General: No scleral icterus.    Extraocular Movements: Extraocular movements intact.     Conjunctiva/sclera: Conjunctivae normal.  Cardiovascular:     Rate and Rhythm: Normal rate and regular rhythm.     Pulses: Normal pulses.     Heart sounds: Normal heart sounds.  Pulmonary:     Effort: Pulmonary effort is normal. No respiratory distress.     Breath sounds: Normal breath sounds.  Musculoskeletal:     Right lower leg: No edema.     Left lower leg: No edema.  Skin:    General: Skin is warm and dry.  Neurological:     Mental Status: She is alert and oriented to person, place, and time. Mental status is at baseline.  Psychiatric:        Mood and Affect: Mood normal.        Behavior: Behavior normal.      Depression Screen    03/12/2024    1:08 PM 02/13/2024   10:56 AM 08/15/2023     8:07 AM 11/09/2022    1:07 PM  PHQ 2/9 Scores  PHQ - 2 Score 2 3 2  0  PHQ- 9 Score 6  10  5   0      Data saved with a previous flowsheet row definition   No results found for any visits on 08/13/24.  Assessment & Plan     Generalized anxiety disorder  Needs flu shot -     Flu vaccine trivalent PF, 6mos and older(Flulaval,Afluria,Fluarix,Fluzone)  Other psoriasis  Presence of Mirena  IUD     Generalized anxiety disorder Chronic, stable on 75 mg sertraline  daily with no reported issues. - Continue sertraline  75 mg daily.  Needs flu shot She is due for the flu shot and will soon be due her third HPV vaccine. Uncertain about hepatitis B vaccination status. Plans to obtain COVID booster at pharmacy. - Defer third HPV vaccine to next visit per patient preference.  May be able to obtain a gynecology appointment in February. - Attempt to obtain childhood vaccination records. - Consider hepatitis B surface antibody test during next blood work if records are unavailable. - Plan for COVID booster at pharmacy.  Other psoriasis Kenalog  ointment effective for psoriasis rash. No acute concerns.  Continue to monitor.   - Continue Kenalog  ointment as needed.  Presents at Mirena  IUD Mirena  IUD placed April 2021, effective until 2029. No history of heavy menstrual bleeding; rarely used for contraception only.  One sexual partner; patient denies concerns about STIs. - Continue Mirena  IUD for contraception. - Follow up with gynecology appointment in February.    Return in about 6 months (around 02/13/2025) for CPE w/next provider.     I discussed the assessment and treatment plan with the patient  The patient was provided an opportunity to ask questions and all were answered. The patient agreed with the plan and demonstrated an understanding  of the instructions.   The patient was advised to call back or seek an in-person evaluation if the symptoms worsen or if the condition fails to improve  as anticipated.    LAURAINE LOISE BUOY, DO  [facility] [phone] (phone) [phone] (fax)  [facility]     [1]  Outpatient Medications Prior to Visit  Medication Sig   levonorgestrel  (MIRENA ) 20 MCG/24HR IUD 1 each by Intrauterine route once.   sertraline  (ZOLOFT ) 50 MG tablet TAKE 1 TABLET BY MOUTH EVERY DAY   triamcinolone  (KENALOG ) 0.025 % ointment Apply 1 Application topically 2 (two) times daily.   No facility-administered medications prior to visit.  [2] No Known Allergies  "

## 2024-08-20 ENCOUNTER — Ambulatory Visit: Admitting: Family Medicine

## 2024-09-04 ENCOUNTER — Encounter: Admitting: Registered Nurse

## 2024-10-29 ENCOUNTER — Ambulatory Visit: Admitting: Physician Assistant
